# Patient Record
Sex: Female | Born: 1990 | Hispanic: No | Marital: Married | Smoking: Former smoker
Health system: Southern US, Community
[De-identification: ages and names within clinical notes are randomized; demographics above are authoritative.]

## PROBLEM LIST (undated history)

## (undated) DIAGNOSIS — J069 Acute upper respiratory infection, unspecified: Secondary | ICD-10-CM

## (undated) DIAGNOSIS — L509 Urticaria, unspecified: Secondary | ICD-10-CM

## (undated) DIAGNOSIS — T7840XA Allergy, unspecified, initial encounter: Secondary | ICD-10-CM

## (undated) DIAGNOSIS — R0600 Dyspnea, unspecified: Secondary | ICD-10-CM

## (undated) DIAGNOSIS — K219 Gastro-esophageal reflux disease without esophagitis: Secondary | ICD-10-CM

## (undated) DIAGNOSIS — L858 Other specified epidermal thickening: Secondary | ICD-10-CM

## (undated) DIAGNOSIS — E282 Polycystic ovarian syndrome: Secondary | ICD-10-CM

## (undated) DIAGNOSIS — J45909 Unspecified asthma, uncomplicated: Secondary | ICD-10-CM

## (undated) DIAGNOSIS — R519 Headache, unspecified: Secondary | ICD-10-CM

## (undated) HISTORY — PX: OTHER SURGICAL HISTORY: SHX169

## (undated) HISTORY — DX: Gastro-esophageal reflux disease without esophagitis: K21.9

## (undated) HISTORY — DX: Acute upper respiratory infection, unspecified: J06.9

## (undated) HISTORY — DX: Unspecified asthma, uncomplicated: J45.909

## (undated) HISTORY — DX: Allergy, unspecified, initial encounter: T78.40XA

## (undated) HISTORY — DX: Urticaria, unspecified: L50.9

## (undated) HISTORY — DX: Dyspnea, unspecified: R06.00

---

## 2020-10-08 ENCOUNTER — Other Ambulatory Visit: Payer: Self-pay

## 2020-10-08 ENCOUNTER — Inpatient Hospital Stay (HOSPITAL_COMMUNITY)
Admission: AD | Admit: 2020-10-08 | Discharge: 2020-10-08 | Disposition: A | Discharge status: Home / Self Care | Payer: Self-pay | Attending: Family Medicine | Admitting: Family Medicine

## 2020-10-08 ENCOUNTER — Encounter (HOSPITAL_COMMUNITY): Payer: Self-pay | Admitting: Obstetrics and Gynecology

## 2020-10-08 DIAGNOSIS — Z3A22 22 weeks gestation of pregnancy: Secondary | ICD-10-CM

## 2020-10-08 DIAGNOSIS — Z3A24 24 weeks gestation of pregnancy: Secondary | ICD-10-CM

## 2020-10-08 DIAGNOSIS — Z3492 Encounter for supervision of normal pregnancy, unspecified, second trimester: Secondary | ICD-10-CM | POA: Insufficient documentation

## 2020-10-08 DIAGNOSIS — Z3689 Encounter for other specified antenatal screening: Secondary | ICD-10-CM

## 2020-10-08 NOTE — Discharge Instructions (Signed)

## 2020-10-08 NOTE — MAU Provider Note (Signed)
[name] is a 30 y.o. G1P0 pregnant female at [redacted]w[redacted]d presenting to MAU to establish routine OB care. She moved here from [place] and has been unable to establish routine OB care anywhere else due to her advanced gestational age. She has no physical complaints and denies vaginal bleeding, contractions, or LOF.  O BP 111/69 (BP Location: Right Arm)    Pulse 90    Temp 98.3 F (36.8 C) (Oral)    Resp 20    Ht 5\' 3"  (1.6 m)    Wt 219 lb 8 oz (99.6 kg)    SpO2 97%    BMI 38.88 kg/m  Physical Exam Vitals and nursing note reviewed.  Constitutional:      General: She is not in acute distress.    Appearance: Normal appearance. She is normal weight. She is not ill-appearing.  HENT:     Head: Normocephalic.     Mouth/Throat:     Mouth: Mucous membranes are moist.  Eyes:     Pupils: Pupils are equal, round, and reactive to light.  Cardiovascular:     Rate and Rhythm: Normal rate.     Pulses: Normal pulses.  Pulmonary:     Effort: Pulmonary effort is normal.  Abdominal:     Palpations: Abdomen is soft.     Comments: Gravid  Musculoskeletal:        General: Normal range of motion.     Cervical back: Normal range of motion.  Skin:    General: Skin is warm and dry.     Capillary Refill: Capillary refill takes less than 2 seconds.  Neurological:     Mental Status: She is alert and oriented to person, place, and time.  Psychiatric:        Mood and Affect: Mood normal.        Behavior: Behavior normal.        Thought Content: Thought content normal.        Judgment: Judgment normal.   FHR: 144  A [redacted] weeks pregnant with fetal heart tones present Medical screening exam complete  P Discharge from MAU in stable condition Pt does not have insurance, will call to establish care at [facility] so she can have patient navigation services as well Warning signs for worsening condition that would warrant emergency follow-up discussed Patient may return to MAU as  needed for emergent OB/GYN related complaints  , CNM 10/08/2020 1:05 PM

## 2020-10-08 NOTE — MAU Note (Addendum)
Presents for general check up.  Reports moved to [place] from [place] and hasn't been seen a MD since arrival.  Reports +FM.  Denies VB or LOF.

## 2020-10-28 ENCOUNTER — Ambulatory Visit (INDEPENDENT_AMBULATORY_CARE_PROVIDER_SITE_OTHER): Payer: 59 | Admitting: Nurse Practitioner

## 2020-10-28 ENCOUNTER — Other Ambulatory Visit: Payer: Self-pay

## 2020-10-28 ENCOUNTER — Encounter: Payer: Self-pay | Admitting: Nurse Practitioner

## 2020-10-28 VITALS — BP 114/71 | HR 101 | Wt 225.2 lb

## 2020-10-28 DIAGNOSIS — Z6834 Body mass index (BMI) 34.0-34.9, adult: Secondary | ICD-10-CM | POA: Insufficient documentation

## 2020-10-28 DIAGNOSIS — Z6841 Body Mass Index (BMI) 40.0 and over, adult: Secondary | ICD-10-CM | POA: Insufficient documentation

## 2020-10-28 DIAGNOSIS — Z3490 Encounter for supervision of normal pregnancy, unspecified, unspecified trimester: Secondary | ICD-10-CM

## 2020-10-28 DIAGNOSIS — O34219 Maternal care for unspecified type scar from previous cesarean delivery: Secondary | ICD-10-CM | POA: Insufficient documentation

## 2020-10-28 DIAGNOSIS — O0990 Supervision of high risk pregnancy, unspecified, unspecified trimester: Secondary | ICD-10-CM | POA: Insufficient documentation

## 2020-10-28 DIAGNOSIS — Z3A27 27 weeks gestation of pregnancy: Secondary | ICD-10-CM

## 2020-10-28 LAB — POCT URINALYSIS DIP (DEVICE)
Bilirubin Urine: NEGATIVE
Glucose, UA: 100 mg/dL — AB
Leukocytes,Ua: NEGATIVE
Nitrite: NEGATIVE
Protein, ur: NEGATIVE mg/dL
Specific Gravity, Urine: >=1.03 (ref 1.005–1.030)
Urobilinogen, UA: 0.2 mg/dL (ref 0.0–1.0)
pH: 5.5 (ref 5.0–8.0)

## 2020-10-28 MED ORDER — BLOOD PRESSURE KIT DEVI: 1.0000 | 0 refills | Status: DC | PRN

## 2020-10-28 NOTE — Progress Notes (Signed)
Subjective:   Shawna Curtis is a 30 y.o. [phone] at 2w2dby date given to her by her doctor in [place]-  being seen today for her first obstetrical visit.  She is a transfer of care as [place] has now moved [place] the [place]  Her obstetrical history is significant for obesity and previous C/S. Patient does intend to breast feed. Pregnancy history fully reviewed.  Some records are available from [place]- scanned into chart.  [place] She has a 30year old and had a Cesarean Birth for [place] baby [place].  Possibly is interested in VBAC. She breastfed for 2 years with the last pregnancy. She has insurance and it needs to be added to her records in this office.  Patient reports no complaints.  HISTORY: OB History  Gravida Para Term Preterm AB Living  '4 1 1 ' 0 2 1  SAB IAB Ectopic Multiple Live Births  2 0 0 0 1    # Outcome Date GA Lbr Len/2nd Weight Sex Delivery Anes PTL Lv  4 Current           3 Term 2018 [id number] lb 11.5 oz (3.5 kg)  CS-Unspec   LIV     Birth Comments: wnl ; c/s due to doctors preference due to hx 2 sab and maybe due to felt opening not enough  2 SAB 2017 [id number]      1 SAB 2016 6w06w0d      History reviewed. No pertinent past medical history. Past Surgical History:  Procedure Laterality Date  . CESAREAN SECTION     Family History  Problem Relation Age of Onset  . Hypertension Father    Social History   Tobacco Use  . Smoking status: Former Smoker    Types: Cigarettes    Quit date: 2010    Years since quitting: 12.0  . Smokeless tobacco: Never Used  Vaping Use  . Vaping Use: Never used  Substance Use Topics  . Alcohol use: Not Currently    Comment: occasionally  . Drug use: Never   No Known Allergies Current Outpatient Medications on File Prior to Visit  Medication Sig Dispense Refill  . Prenatal Vit-Fe Fumarate-FA (PRENATAL VITAMINS PO) Take 1  capsule by mouth daily.     No current facility-administered medications on file prior to visit.     Exam   Vitals:   10/28/20 1040  BP: 114/71  Pulse: (!) 101  Weight: 225 lb 3.2 oz (102.2 kg)   Fetal Heart Rate (bpm): 152  Uterus:  Fundal Height: 31 cm  Pelvic Exam: Perineum: Deferred pelvic   Vulva:    Vagina:     Cervix:    Adnexa:    Bony Pelvis:   System: General: well-developed, well-nourished female in no acute distress   Breast:  deferred   Skin: normal coloration and turgor, no rashes   Neurologic: oriented, normal, negative, normal mood   Extremities: normal strength, tone, and muscle mass, ROM of all joints is normal   HEENT extraocular movement intact and sclera clear, anicteric   Mouth/Teeth deferred   Neck supple and no masses, normal thyroid   Cardiovascular: regular rate and rhythm   Respiratory:  no respiratory distress, normal breath sounds   Abdomen: soft, non-tender; no masses,  no organomegaly     Assessment:  Pregnancy: [id number] Patient Active Problem List   Diagnosis Date Noted  . Supervision of low-risk pregnancy 10/28/2020  . Obesity in pregnancy 10/28/2020  . History of cesarean delivery, currently pregnant 10/28/2020     Plan:  1. Encounter for supervision of low-risk pregnancy, antepartum Doing well - no complaints Will schedule for anatomy scan and use to confirm dating - given LMP from her MD in [place]. Speaks English Husband works for a Arts administrator in the area [place] at next visit.  - [facility] AMB BABYSCRIPTS SCHEDULE OPTIMIZATION - Blood Pressure Monitoring (BLOOD PRESSURE KIT) DEVI; 1 Device by Does not apply route as needed.  Dispense: 1 each; Refill: 0 - [place] MFM OB DETAIL +14 WK; Future - CBC/D/Plt+RPR+Rh+ABO+Rub Ab... - Hemoglobin A1c - Culture, OB Urine  2. Obesity in pregnancy  - [place] MFM OB DETAIL +14 WK; Future  3.  History of C/S  Considering VBAC - Will schedule with MD for delivery plan  Initial labs  drawn. Continue prenatal vitamins. Genetic Screening discussed, NIPS: discussed and waiting to see if insurance covers maternity care. Ultrasound discussed; fetal anatomic survey: ordered. Problem list reviewed and updated. The nature of [place] with multiple MDs and other Advanced Practice Providers was explained to patient; also emphasized that residents, students are part of our team. Routine obstetric precautions reviewed. Return in 1 week (on 11/04/2020) for schedule for early morning fasting Glucola and MD visit for birth plan.  Total face-to-face time with patient: 40 minutes.  Over 50% of encounter was spent on counseling and coordination of care.   Earlie Server, FNP Family Nurse Practitioner, [facility] for [facility], [place] 10/28/2020 12:58 PM  Addendum: Anatomy scan at MFM with [facility] as 01-16-21  Will need to discuss with client at next visit.   Earlie Server, RN, MSN, NP-BC Nurse Practitioner, [facility] for [employer], [place] 10/30/2020 3:22 PM

## 2020-10-28 NOTE — Progress Notes (Signed)
Here for initial prenatal visit. States EDD was told to her by doctor in [place] where she started prenatal care. She brought records. Last visit there about 7 weeks ago. Given new patient packet.  C/o SOB at times. Oxygen saturation 97%. Sent [employer] app to download.Sent bp cuff rx.  Sent MyChart text to sign up. Declines flu shot . Jacques Earthly

## 2020-10-29 ENCOUNTER — Ambulatory Visit: Payer: 59 | Admitting: *Deleted

## 2020-10-29 ENCOUNTER — Other Ambulatory Visit: Payer: Self-pay | Admitting: *Deleted

## 2020-10-29 ENCOUNTER — Ambulatory Visit: Payer: 59 | Attending: Obstetrics and Gynecology

## 2020-10-29 ENCOUNTER — Encounter: Payer: Self-pay | Admitting: *Deleted

## 2020-10-29 DIAGNOSIS — Z6834 Body mass index (BMI) 34.0-34.9, adult: Secondary | ICD-10-CM

## 2020-10-29 DIAGNOSIS — Z3490 Encounter for supervision of normal pregnancy, unspecified, unspecified trimester: Secondary | ICD-10-CM | POA: Diagnosis present

## 2020-10-29 LAB — CBC/D/PLT+RPR+RH+ABO+RUB AB...
Antibody Screen: NEGATIVE
Basophils Absolute: 0.1 10*3/uL (ref 0.0–0.2)
Basos: 1 %
EOS (ABSOLUTE): 0.3 10*3/uL (ref 0.0–0.4)
Eos: 2 %
HCV Ab: <0.1 s/co ratio (ref 0.0–0.9)
HIV Screen 4th Generation wRfx: NONREACTIVE
Hematocrit: 37.1 % (ref 34.0–46.6)
Hemoglobin: 12 g/dL (ref 11.1–15.9)
Hepatitis B Surface Ag: NEGATIVE
Immature Grans (Abs): 0.5 10*3/uL — ABNORMAL HIGH (ref 0.0–0.1)
Immature Granulocytes: 4 %
Lymphocytes Absolute: 1.8 10*3/uL (ref 0.7–3.1)
Lymphs: 15 %
MCH: 27.4 pg (ref 26.6–33.0)
MCHC: 32.3 g/dL (ref 31.5–35.7)
MCV: 85 fL (ref 79–97)
Monocytes Absolute: 0.5 10*3/uL (ref 0.1–0.9)
Monocytes: 4 %
Neutrophils Absolute: 9.2 10*3/uL — ABNORMAL HIGH (ref 1.4–7.0)
Neutrophils: 74 %
Platelets: 330 10*3/uL (ref 150–450)
RBC: 4.38 x10E6/uL (ref 3.77–5.28)
RDW: 13.9 % (ref 11.7–15.4)
RPR Ser Ql: NONREACTIVE
Rh Factor: POSITIVE
Rubella Antibodies, IGG: <0.9 index — ABNORMAL LOW (ref >0.99)
WBC: 12.3 10*3/uL — ABNORMAL HIGH (ref 3.4–10.8)

## 2020-10-29 LAB — HEMOGLOBIN A1C
Est. average glucose Bld gHb Est-mCnc: 111 mg/dL
Hgb A1c MFr Bld: 5.5 % (ref 4.8–5.6)

## 2020-10-29 LAB — HCV INTERPRETATION

## 2020-10-30 ENCOUNTER — Other Ambulatory Visit: Payer: Self-pay | Admitting: *Deleted

## 2020-10-30 DIAGNOSIS — Z3492 Encounter for supervision of normal pregnancy, unspecified, second trimester: Secondary | ICD-10-CM

## 2020-10-31 LAB — CULTURE, OB URINE

## 2020-10-31 LAB — URINE CULTURE, OB REFLEX

## 2020-11-03 ENCOUNTER — Telehealth: Payer: Self-pay

## 2020-11-04 ENCOUNTER — Ambulatory Visit (INDEPENDENT_AMBULATORY_CARE_PROVIDER_SITE_OTHER): Payer: 59 | Admitting: Family Medicine

## 2020-11-04 ENCOUNTER — Encounter: Payer: Self-pay | Admitting: Family Medicine

## 2020-11-04 ENCOUNTER — Other Ambulatory Visit: Payer: 59

## 2020-11-04 ENCOUNTER — Other Ambulatory Visit: Payer: Self-pay

## 2020-11-04 VITALS — BP 103/71 | HR 103 | Wt 223.4 lb

## 2020-11-04 DIAGNOSIS — Z3490 Encounter for supervision of normal pregnancy, unspecified, unspecified trimester: Secondary | ICD-10-CM

## 2020-11-04 DIAGNOSIS — H53419 Scotoma involving central area, unspecified eye: Secondary | ICD-10-CM | POA: Insufficient documentation

## 2020-11-04 DIAGNOSIS — O34219 Maternal care for unspecified type scar from previous cesarean delivery: Secondary | ICD-10-CM

## 2020-11-04 DIAGNOSIS — O99891 Other specified diseases and conditions complicating pregnancy: Secondary | ICD-10-CM

## 2020-11-04 DIAGNOSIS — O09899 Supervision of other high risk pregnancies, unspecified trimester: Secondary | ICD-10-CM

## 2020-11-04 DIAGNOSIS — Z2839 Other underimmunization status: Secondary | ICD-10-CM | POA: Insufficient documentation

## 2020-11-04 DIAGNOSIS — H53412 Scotoma involving central area, left eye: Secondary | ICD-10-CM

## 2020-11-04 DIAGNOSIS — Z283 Underimmunization status: Secondary | ICD-10-CM

## 2020-11-04 NOTE — Patient Instructions (Addendum)
[facility] OFFICE [address], [place], [place], [address] T [phone] Email: [email or web address]  Go on 11/07/2020 Friday at 8 AM go to [facility] and Dr. Allyne Gee will see you    Contraception Choices Contraception, also called birth control, refers to methods or devices that prevent pregnancy. Hormonal methods Contraceptive implant A contraceptive implant is a thin, plastic tube that contains a hormone that prevents pregnancy. It is different from an intrauterine device (IUD). It is inserted into the upper part of the arm by a health care provider. Implants can be effective for up to 3 years. Progestin-only injections Progestin-only injections are injections of progestin, a synthetic form of the hormone progesterone. They are given every 3 months by a health care provider. Birth control pills Birth control pills are pills that contain hormones that prevent pregnancy. They must be taken once a day, preferably at the same time each day. A prescription is needed to use this method of contraception. Birth control patch The birth control patch contains hormones that prevent pregnancy. It is placed on the skin and must be changed once a week for three weeks and removed on the fourth week. A prescription is needed to use this method of contraception. Vaginal ring A vaginal ring contains hormones that prevent pregnancy. It is placed in the vagina for three weeks and removed on the fourth week. After that, the process is repeated with a new ring. A prescription is needed to use this method of contraception. Emergency contraceptive Emergency contraceptives prevent pregnancy after unprotected sex. They come in pill form and can be taken up to 5 days after sex. They work best the sooner they are taken after having sex. Most emergency contraceptives are available without a prescription. This method should not be used as your only form of birth control.    Barrier methods Female condom A female condom is a thin sheath that is worn over the penis during sex. Condoms keep sperm from going inside a woman's body. They can be used with a sperm-killing substance (spermicide) to increase their effectiveness. They should be thrown away after one use. Female condom A female condom is a soft, loose-fitting sheath that is put into the vagina before sex. The condom keeps sperm from going inside a woman's body. They should be thrown away after one use. Diaphragm A diaphragm is a soft, dome-shaped barrier. It is inserted into the vagina before sex, along with a spermicide. The diaphragm blocks sperm from entering the uterus, and the spermicide kills sperm. A diaphragm should be left in the vagina for 6-8 hours after sex and removed within 24 hours. A diaphragm is prescribed and fitted by a health care provider. A diaphragm should be replaced every 1-2 years, after giving birth, after gaining more than 15 lb (6.8 kg), and after pelvic surgery. Cervical cap A cervical cap is a round, soft latex or plastic cup that fits over the cervix. It is inserted into the vagina before sex, along with spermicide. It blocks sperm from entering the uterus. The cap should be left in place for 6-8 hours after sex and removed within 48 hours. A cervical cap must be prescribed and fitted by a health care provider. It should be replaced every 2 years. Sponge A sponge is a soft, circular piece of polyurethane foam with spermicide in it. The sponge helps block sperm from entering the uterus, and the spermicide kills sperm. To use it, you make it wet and then insert it  into the vagina. It should be inserted before sex, left in for at least 6 hours after sex, and removed and thrown away within 30 hours. Spermicides Spermicides are chemicals that kill or block sperm from entering the cervix and uterus. They can come as a cream, Leveda, suppository, foam, or tablet. A spermicide should be inserted  into the vagina with an applicator at least 10-15 minutes before sex to allow time for it to work. The process must be repeated every time you have sex. Spermicides do not require a prescription.   Intrauterine contraception Intrauterine device (IUD) An IUD is a T-shaped device that is put in a woman's uterus. There are two types:  Hormone IUD.This type contains progestin, a synthetic form of the hormone progesterone. This type can stay in place for 3-5 years.  Copper IUD.This type is wrapped in copper wire. It can stay in place for 10 years. Permanent methods of contraception Female tubal ligation In this method, a woman's fallopian tubes are sealed, tied, or blocked during surgery to prevent eggs from traveling to the uterus. Hysteroscopic sterilization In this method, a small, flexible insert is placed into each fallopian tube. The inserts cause scar tissue to form in the fallopian tubes and block them, so sperm cannot reach an egg. The procedure takes about 3 months to be effective. Another form of birth control must be used during those 3 months. Female sterilization This is a procedure to tie off the tubes that carry sperm (vasectomy). After the procedure, the man can still ejaculate fluid (semen). Another form of birth control must be used for 3 months after the procedure. Natural planning methods Natural family planning In this method, a couple does not have sex on days when the woman could become pregnant. Calendar method In this method, the woman keeps track of the length of each menstrual cycle, identifies the days when pregnancy can happen, and does not have sex on those days. Ovulation method In this method, a couple avoids sex during ovulation. Symptothermal method This method involves not having sex during ovulation. The woman typically checks for ovulation by watching changes in her temperature and in the consistency of cervical mucus. Post-ovulation method In this method, a  couple waits to have sex until after ovulation. Where to find more information  Centers for Disease Control and Prevention: [employer].com.brwww.cdc.gov Summary  Contraception, also called birth control, refers to methods or devices that prevent pregnancy.  Hormonal methods of contraception include implants, injections, pills, patches, vaginal rings, and emergency contraceptives.  Barrier methods of contraception can include female condoms, female condoms, diaphragms, cervical caps, sponges, and spermicides.  There are two types of IUDs (intrauterine devices). An IUD can be put in a woman's uterus to prevent pregnancy for 3-5 years.  Permanent sterilization can be done through a procedure for males and females. Natural family planning methods involve nothaving sex on days when the woman could become pregnant. This information is not intended to replace advice given to you by your health care provider. Make sure you discuss any questions you have with your health care provider. Document Revised: 02/18/2020 Document Reviewed: 02/18/2020 Elsevier Patient Education  2021 Elsevier Inc.   Breastfeeding  Choosing to breastfeed is one of the best decisions you can make for yourself and your baby. A change in hormones during pregnancy causes your breasts to make breast milk in your milk-producing glands. Hormones prevent breast milk from being released before your baby is born. They also prompt milk flow after birth.  Once breastfeeding has begun, thoughts of your baby, as well as his or her sucking or crying, can stimulate the release of milk from your milk-producing glands. Benefits of breastfeeding Research shows that breastfeeding offers many health benefits for infants and mothers. It also offers a cost-free and convenient way to feed your baby. For your baby  Your first milk (colostrum) helps your baby's digestive system to function better.  Special cells in your milk (antibodies) help your baby to fight off  infections.  Breastfed babies are less likely to develop asthma, allergies, obesity, or type 2 diabetes. They are also at lower risk for sudden infant death syndrome (SIDS).  Nutrients in breast milk are better able to meet your baby's needs compared to infant formula.  Breast milk improves your baby's brain development. For you  Breastfeeding helps to create a very special bond between you and your baby.  Breastfeeding is convenient. Breast milk costs nothing and is always available at the correct temperature.  Breastfeeding helps to burn calories. It helps you to lose the weight that you gained during pregnancy.  Breastfeeding makes your uterus return faster to its size before pregnancy. It also slows bleeding (lochia) after you give birth.  Breastfeeding helps to lower your risk of developing type 2 diabetes, osteoporosis, rheumatoid arthritis, cardiovascular disease, and breast, ovarian, uterine, and endometrial cancer later in life. Breastfeeding basics Starting breastfeeding  Find a comfortable place to sit or lie down, with your neck and back well-supported.  Place a pillow or a rolled-up blanket under your baby to bring him or her to the level of your breast (if you are seated). Nursing pillows are specially designed to help support your arms and your baby while you breastfeed.  Make sure that your baby's tummy (abdomen) is facing your abdomen.  Gently massage your breast. With your fingertips, massage from the outer edges of your breast inward toward the nipple. This encourages milk flow. If your milk flows slowly, you may need to continue this action during the feeding.  Support your breast with 4 fingers underneath and your thumb above your nipple (make the letter "C" with your hand). Make sure your fingers are well away from your nipple and your baby's mouth.  Stroke your baby's lips gently with your finger or nipple.  When your baby's mouth is open wide enough, quickly  bring your baby to your breast, placing your entire nipple and as much of the areola as possible into your baby's mouth. The areola is the colored area around your nipple. ? More areola should be visible above your baby's upper lip than below the lower lip. ? Your baby's lips should be opened and extended outward (flanged) to ensure an adequate, comfortable latch. ? Your baby's tongue should be between his or her lower gum and your breast.  Make sure that your baby's mouth is correctly positioned around your nipple (latched). Your baby's lips should create a seal on your breast and be turned out (everted).  It is common for your baby to suck about 2-3 minutes in order to start the flow of breast milk. Latching Teaching your baby how to latch onto your breast properly is very important. An improper latch can cause nipple pain, decreased milk supply, and poor weight gain in your baby. Also, if your baby is not latched onto your nipple properly, he or she may swallow some air during feeding. This can make your baby fussy. Burping your baby when you switch breasts during the  feeding can help to get rid of the air. However, teaching your baby to latch on properly is still the best way to prevent fussiness from swallowing air while breastfeeding. Signs that your baby has successfully latched onto your nipple  Silent tugging or silent sucking, without causing you pain. Infant's lips should be extended outward (flanged).  Swallowing heard between every 3-4 sucks once your milk has started to flow (after your let-down milk reflex occurs).  Muscle movement above and in front of his or her ears while sucking. Signs that your baby has not successfully latched onto your nipple  Sucking sounds or smacking sounds from your baby while breastfeeding.  Nipple pain. If you think your baby has not latched on correctly, slip your finger into the corner of your baby's mouth to break the suction and place it between  your baby's gums. Attempt to start breastfeeding again. Signs of successful breastfeeding Signs from your baby  Your baby will gradually decrease the number of sucks or will completely stop sucking.  Your baby will fall asleep.  Your baby's body will relax.  Your baby will retain a small amount of milk in his or her mouth.  Your baby will let go of your breast by himself or herself. Signs from you  Breasts that have increased in firmness, weight, and size 1-3 hours after feeding.  Breasts that are softer immediately after breastfeeding.  Increased milk volume, as well as a change in milk consistency and color by the fifth day of breastfeeding.  Nipples that are not sore, cracked, or bleeding. Signs that your baby is getting enough milk  Wetting at least 1-2 diapers during the first 24 hours after birth.  Wetting at least 5-6 diapers every 24 hours for the first week after birth. The urine should be clear or pale yellow by the age of 5 days.  Wetting 6-8 diapers every 24 hours as your baby continues to grow and develop.  At least 3 stools in a 24-hour period by the age of 5 days. The stool should be soft and yellow.  At least 3 stools in a 24-hour period by the age of 7 days. The stool should be seedy and yellow.  No loss of weight greater than 10% of birth weight during the first 3 days of life.  Average weight gain of 4-7 oz (113-198 g) per week after the age of 4 days.  Consistent daily weight gain by the age of 5 days, without weight loss after the age of 2 weeks. After a feeding, your baby may spit up a small amount of milk. This is normal. Breastfeeding frequency and duration Frequent feeding will help you make more milk and can prevent sore nipples and extremely full breasts (breast engorgement). Breastfeed when you feel the need to reduce the fullness of your breasts or when your baby shows signs of hunger. This is called "breastfeeding on demand." Signs that your baby  is hungry include:  Increased alertness, activity, or restlessness.  Movement of the head from side to side.  Opening of the mouth when the corner of the mouth or cheek is stroked (rooting).  Increased sucking sounds, smacking lips, cooing, sighing, or squeaking.  Hand-to-mouth movements and sucking on fingers or hands.  Fussing or crying. Avoid introducing a pacifier to your baby in the first 4-6 weeks after your baby is born. After this time, you may choose to use a pacifier. Research has shown that pacifier use during the first year of  a baby's life decreases the risk of sudden infant death syndrome (SIDS). Allow your baby to feed on each breast as long as he or she wants. When your baby unlatches or falls asleep while feeding from the first breast, offer the second breast. Because newborns are often sleepy in the first few weeks of life, you may need to awaken your baby to get him or her to feed. Breastfeeding times will vary from baby to baby. However, the following rules can serve as a guide to help you make sure that your baby is properly fed:  Newborns (babies 19 weeks of age or younger) may breastfeed every 1-3 hours.  Newborns should not go without breastfeeding for longer than 3 hours during the day or 5 hours during the night.  You should breastfeed your baby a minimum of 8 times in a 24-hour period. Breast milk pumping Pumping and storing breast milk allows you to make sure that your baby is exclusively fed your breast milk, even at times when you are unable to breastfeed. This is especially important if you go back to work while you are still breastfeeding, or if you are not able to be present during feedings. Your lactation consultant can help you find a method of pumping that works best for you and give you guidelines about how long it is safe to store breast milk.      Caring for your breasts while you breastfeed Nipples can become dry, cracked, and sore while  breastfeeding. The following recommendations can help keep your breasts moisturized and healthy:  Avoid using soap on your nipples.  Wear a supportive bra designed especially for nursing. Avoid wearing underwire-style bras or extremely tight bras (sports bras).  Air-dry your nipples for 3-4 minutes after each feeding.  Use only cotton bra pads to absorb leaked breast milk. Leaking of breast milk between feedings is normal.  Use lanolin on your nipples after breastfeeding. Lanolin helps to maintain your skin's normal moisture barrier. Pure lanolin is not harmful (not toxic) to your baby. You may also hand express a few drops of breast milk and gently massage that milk into your nipples and allow the milk to air-dry. In the first few weeks after giving birth, some women experience breast engorgement. Engorgement can make your breasts feel heavy, warm, and tender to the touch. Engorgement peaks within 3-5 days after you give birth. The following recommendations can help to ease engorgement:  Completely empty your breasts while breastfeeding or pumping. You may want to start by applying warm, moist heat (in the shower or with warm, water-soaked hand towels) just before feeding or pumping. This increases circulation and helps the milk flow. If your baby does not completely empty your breasts while breastfeeding, pump any extra milk after he or she is finished.  Apply ice packs to your breasts immediately after breastfeeding or pumping, unless this is too uncomfortable for you. To do this: ? Put ice in a plastic bag. ? Place a towel between your skin and the bag. ? Leave the ice on for 20 minutes, 2-3 times a day.  Make sure that your baby is latched on and positioned properly while breastfeeding. If engorgement persists after 48 hours of following these recommendations, contact your health care provider or a Advertising copywriter. Overall health care recommendations while breastfeeding  Eat 3  healthy meals and 3 snacks every day. Well-nourished mothers who are breastfeeding need an additional 450-500 calories a day. You can meet this requirement by increasing  the amount of a balanced diet that you eat.  Drink enough water to keep your urine pale yellow or clear.  Rest often, relax, and continue to take your prenatal vitamins to prevent fatigue, stress, and low vitamin and mineral levels in your body (nutrient deficiencies).  Do not use any products that contain nicotine or tobacco, such as cigarettes and e-cigarettes. Your baby may be harmed by chemicals from cigarettes that pass into breast milk and exposure to secondhand smoke. If you need help quitting, ask your health care provider.  Avoid alcohol.  Do not use illegal drugs or marijuana.  Talk with your health care provider before taking any medicines. These include over-the-counter and prescription medicines as well as vitamins and herbal supplements. Some medicines that may be harmful to your baby can pass through breast milk.  It is possible to become pregnant while breastfeeding. If birth control is desired, ask your health care provider about options that will be safe while breastfeeding your baby. Where to find more information: [employer]: www.llli.org Contact a health care provider if:  You feel like you want to stop breastfeeding or have become frustrated with breastfeeding.  Your nipples are cracked or bleeding.  Your breasts are red, tender, or warm.  You have: ? Painful breasts or nipples. ? A swollen area on either breast. ? A fever or chills. ? Nausea or vomiting. ? Drainage other than breast milk from your nipples.  Your breasts do not become full before feedings by the fifth day after you give birth.  You feel sad and depressed.  Your baby is: ? Too sleepy to eat well. ? Having trouble sleeping. ? More than 51 week old and wetting fewer than 6 diapers in a 24-hour period. ? Not  gaining weight by 18 days of age.  Your baby has fewer than 3 stools in a 24-hour period.  Your baby's skin or the white parts of his or her eyes become yellow. Get help right away if:  Your baby is overly tired (lethargic) and does not want to wake up and feed.  Your baby develops an unexplained fever. Summary  Breastfeeding offers many health benefits for infant and mothers.  Try to breastfeed your infant when he or she shows early signs of hunger.  Gently tickle or stroke your baby's lips with your finger or nipple to allow the baby to open his or her mouth. Bring the baby to your breast. Make sure that much of the areola is in your baby's mouth. Offer one side and burp the baby before you offer the other side.  Talk with your health care provider or lactation consultant if you have questions or you face problems as you breastfeed. This information is not intended to replace advice given to you by your health care provider. Make sure you discuss any questions you have with your health care provider. Document Revised: 12/08/2017 Document Reviewed: 10/15/2016 Elsevier Patient Education  2021 [employer].

## 2020-11-04 NOTE — Progress Notes (Signed)
   Subjective:  Shawna Curtis is a 30 y.o. [phone] at [redacted]w[redacted]d being seen today for ongoing prenatal care.  She is currently monitored for the following issues for this low-risk pregnancy and has Supervision of low-risk pregnancy; BMI 34.0-34.9,adult; and History of cesarean delivery, currently pregnant on their problem list.  Patient reports purple circle of vision loss in her left eye that has been present for one week.  Contractions: Not present. Vag. Bleeding: None.  Movement: Present. Denies leaking of fluid.   The following portions of the patient's history were reviewed and updated as appropriate: allergies, current medications, past family history, past medical history, past social history, past surgical history and problem list. Problem list updated.  Objective:   Vitals:   11/04/20 0852  BP: 103/71  Pulse: (!) 103  Weight: 223 lb 6.4 oz (101.3 kg)    Fetal Status: Fetal Heart Rate (bpm): 150   Movement: Present     General:  Alert, oriented and cooperative. Patient is in no acute distress.  Skin: Skin is warm and dry. No rash noted.   Cardiovascular: Normal heart rate noted  Respiratory: Normal respiratory effort, no problems with respiration noted  Abdomen: Soft, gravid, appropriate for gestational age. Pain/Pressure: Present     Pelvic: Vag. Bleeding: None     Cervical exam deferred        Extremities: Normal range of motion.  Edema: None  Neuro: EOM intact, visual fields intact. Opthalmoscope evaluation shows no retinal hemorrhages, no evidence of retinal detachment, no optic disc cupping, normal vasculature  Mental Status: Anxious mood and affect.   Urinalysis:      Assessment and Plan:  Pregnancy: [id number] at [redacted]w[redacted]d  1. Encounter for supervision of low-risk pregnancy, antepartum 28wk labs TDaP offered today, will decide at next visit BP and FHR normal - CBC - HIV Antibody (routine testing w rflx) - RPR  2. History of cesarean delivery, currently  pregnant Long conversation with patient regarding risks and benefits of TOLAC vs scheduled RCS Per discussion with patient she was told she would be unable to have a vaginal birth and had a scheduled cesarean at 38 weeks that was uncomplicated Reviewed risk and benefits, encouraged her to [facility] She needs to think about, will let us know at her next visit  3. Scotoma Patient reporting new scotoma present for the past week, no hx of thromboembolism, HTN, DM Normal neuro and opthalmoscope exams Spoke with on call ophthalmologist Dr. Allyne Gee, no need for ED eval but he will see patient at [facility] Friday morning for an evaluation  Preterm labor symptoms and general obstetric precautions including but not limited to vaginal bleeding, contractions, leaking of fluid and fetal movement were reviewed in detail with the patient. Please refer to After Visit Summary for other counseling recommendations.  Return in 2 weeks (on 11/18/2020) for [facility], ob visit.   Venora Maples, MD

## 2020-11-05 LAB — CBC
Hematocrit: 36.5 % (ref 34.0–46.6)
Hemoglobin: 11.6 g/dL (ref 11.1–15.9)
MCH: 26.9 pg (ref 26.6–33.0)
MCHC: 31.8 g/dL (ref 31.5–35.7)
MCV: 85 fL (ref 79–97)
Platelets: 310 x10E3/uL (ref 150–450)
RBC: 4.32 x10E6/uL (ref 3.77–5.28)
RDW: 13.8 % (ref 11.7–15.4)
WBC: 13.5 x10E3/uL — ABNORMAL HIGH (ref 3.4–10.8)

## 2020-11-05 LAB — GLUCOSE TOLERANCE, 2 HOURS W/ 1HR
Glucose, 1 hour: 174 mg/dL (ref 65–179)
Glucose, 2 hour: 138 mg/dL (ref 65–152)
Glucose, Fasting: 86 mg/dL (ref 65–91)

## 2020-11-05 LAB — RPR: RPR Ser Ql: NONREACTIVE

## 2020-11-05 LAB — HIV ANTIBODY (ROUTINE TESTING W REFLEX): HIV Screen 4th Generation wRfx: NONREACTIVE

## 2020-11-17 NOTE — Telephone Encounter (Signed)
Opened in error

## 2020-11-18 ENCOUNTER — Other Ambulatory Visit: Payer: Self-pay

## 2020-11-18 ENCOUNTER — Ambulatory Visit (INDEPENDENT_AMBULATORY_CARE_PROVIDER_SITE_OTHER): Payer: 59 | Admitting: Nurse Practitioner

## 2020-11-18 VITALS — BP 100/70 | HR 106 | Wt 224.7 lb

## 2020-11-18 DIAGNOSIS — O34219 Maternal care for unspecified type scar from previous cesarean delivery: Secondary | ICD-10-CM

## 2020-11-18 DIAGNOSIS — O26899 Other specified pregnancy related conditions, unspecified trimester: Secondary | ICD-10-CM

## 2020-11-18 DIAGNOSIS — Z3A31 31 weeks gestation of pregnancy: Secondary | ICD-10-CM

## 2020-11-18 DIAGNOSIS — Z3493 Encounter for supervision of normal pregnancy, unspecified, third trimester: Secondary | ICD-10-CM

## 2020-11-18 DIAGNOSIS — R102 Pelvic and perineal pain: Secondary | ICD-10-CM

## 2020-11-18 DIAGNOSIS — H53412 Scotoma involving central area, left eye: Secondary | ICD-10-CM

## 2020-11-18 NOTE — Progress Notes (Signed)
    Subjective:  Shawna Curtis is a 30 y.o. [phone] at [redacted]w[redacted]d being seen today for ongoing prenatal care.  She is currently monitored for the following issues for this low-risk pregnancy and has Supervision of low-risk pregnancy; BMI 34.0-34.9,adult; History of cesarean delivery, currently pregnant; Rubella non-immune status, antepartum; and Scotoma on their problem list.  Patient reports pelvic pain.  Contractions: Not present. Vag. Bleeding: None.  Movement: Present. Denies leaking of fluid.   The following portions of the patient's history were reviewed and updated as appropriate: allergies, current medications, past family history, past medical history, past social history, past surgical history and problem list. Problem list updated.  Objective:   Vitals:   11/18/20 1053  BP: 100/70  Pulse: (!) 106  Weight: 224 lb 11.2 oz (101.9 kg)    Fetal Status: Fetal Heart Rate (bpm): 148 Fundal Height: 33 cm Movement: Present     General:  Alert, oriented and cooperative. Patient is in no acute distress.  Skin: Skin is warm and dry. No rash noted.   Cardiovascular: Normal heart rate noted  Respiratory: Normal respiratory effort, no problems with respiration noted  Abdomen: Soft, gravid, appropriate for gestational age. Pain/Pressure: Absent     Pelvic:  Cervical exam deferred        Extremities: Normal range of motion.  Edema: None  Mental Status: Normal mood and affect. Normal behavior. Normal judgment and thought content.   Urinalysis:      Assessment and Plan:  Pregnancy: G4P1021 at [redacted]w[redacted]d  1. Encounter for supervision of low-risk pregnancy in third trimester Discussed TDAP - was not well informed about the possibility of pertussus for a newborn.  Reviewed risks for newborns and that TDAP is used for all pregnant women across the [place].  Was not done with her previous child in [place].  2. Visual field scotoma of left eye Was not aware of the time of her appointment last  Friday Has not rescheduled - advised to call today to reschedule Still has symptoms in her left eye - does cause problems with her perception when walking  3. History of cesarean delivery, currently pregnant Planning TOLAC (she thinks) - needs to sign consent; did not sign at today's visit  4. Pelvic pain affecting pregnancy, antepartum Has had back pain since prior to pregnancy.  Is now worse with pregnancy and having lower abdominal and pelvic pain Suggested pregnancy support belt also - not covered by insurance Will do referral to PT also - Ambulatory referral to Physical Therapy  Preterm labor symptoms and general obstetric precautions including but not limited to vaginal bleeding, contractions, leaking of fluid and fetal movement were reviewed in detail with the patient. Please refer to After Visit Summary for other counseling recommendations.  Return in about 2 weeks (around 12/02/2020).  Nolene Bernheim, RN, MSN, NP-BC Nurse Practitioner, [facility] for [employer], [facility] 11/18/2020 12:05 PM

## 2020-11-18 NOTE — Patient Instructions (Addendum)
[email or web address]   Tdap (Tetanus, Diphtheria, Pertussis) Vaccine: What You Need to Know 1. Why get vaccinated? Tdap vaccine can prevent tetanus, diphtheria, and pertussis. Diphtheria and pertussis spread from person to person. Tetanus enters the body through cuts or wounds.  TETANUS (T) causes painful stiffening of the muscles. Tetanus can lead to serious health problems, including being unable to open the mouth, having trouble swallowing and breathing, or death.  DIPHTHERIA (D) can lead to difficulty breathing, heart failure, paralysis, or death.  PERTUSSIS (aP), also known as "whooping cough," can cause uncontrollable, violent coughing that makes it hard to breathe, eat, or drink. Pertussis can be extremely serious especially in babies and young children, causing pneumonia, convulsions, brain damage, or death. In teens and adults, it can cause weight loss, loss of bladder control, passing out, and rib fractures from severe coughing. 2. Tdap vaccine Tdap is only for children 7 years and older, adolescents, and adults.  Adolescents should receive a single dose of Tdap, preferably at age 30 or 12 years. Pregnant people should get a dose of Tdap during every pregnancy, preferably during the early part of the third trimester, to help protect the newborn from pertussis. Infants are most at risk for severe, life-threatening complications from pertussis. Adults who have never received Tdap should get a dose of Tdap. Also, adults should receive a booster dose of either Tdap or Td (a different vaccine that protects against tetanus and diphtheria but not pertussis) every 10 years, or after 5 years in the case of a severe or dirty wound or burn. Tdap may be given at the same time as other vaccines. 3. Talk with your health care provider Tell your vaccine provider if the person getting the vaccine:  Has had an allergic reaction after a previous dose of any vaccine that protects against tetanus,  diphtheria, or pertussis, or has any severe, life-threatening allergies  Has had a coma, decreased level of consciousness, or prolonged seizures within 7 days after a previous dose of any pertussis vaccine (DTP, DTaP, or Tdap)  Has seizures or another nervous system problem  Has ever had Guillain-Barr Syndrome (also called "GBS")  Has had severe pain or swelling after a previous dose of any vaccine that protects against tetanus or diphtheria In some cases, your health care provider may decide to postpone Tdap vaccination until a future visit. People with minor illnesses, such as a cold, may be vaccinated. People who are moderately or severely ill should usually wait until they recover before getting Tdap vaccine.  Your health care provider can give you more information. 4. Risks of a vaccine reaction  Pain, redness, or swelling where the shot was given, mild fever, headache, feeling tired, and nausea, vomiting, diarrhea, or stomachache sometimes happen after Tdap vaccination. People sometimes faint after medical procedures, including vaccination. Tell your provider if you feel dizzy or have vision changes or ringing in the ears.  As with any medicine, there is a very remote chance of a vaccine causing a severe allergic reaction, other serious injury, or death. 5. What if there is a serious problem? An allergic reaction could occur after the vaccinated person leaves the clinic. If you see signs of a severe allergic reaction (hives, swelling of the face and throat, difficulty breathing, a fast heartbeat, dizziness, or weakness), call 9-1-1 and get the person to the nearest hospital. For other signs that concern you, call your health care provider.  Adverse reactions should be reported to the Vaccine Adverse Event Reporting System (  VAERS). Your health care provider will usually file this report, or you can do it yourself. Visit the VAERS website at www.vaers.LAgents.no or call 210-844-5775. VAERS is  only for reporting reactions, and VAERS staff members do not give medical advice. 6. The National Vaccine Injury Compensation Program The [employer] Vaccine Injury Compensation Program (VICP) is a federal program that was created to compensate people who may have been injured by certain vaccines. Claims regarding alleged injury or death due to vaccination have a time limit for filing, which may be as short as two years. Visit the VICP website at SpiritualWord.at or call 971-383-7066 to learn about the program and about filing a claim. 7. How can I learn more?  Ask your health care provider.  Call your local or state health department.  Visit the website of the Food and Drug Administration (FDA) for vaccine package inserts and additional information at FinderList.no.  Contact the Centers for Disease Control and Prevention (CDC): ? Call 531 069 3312 (1-800-CDC-INFO) or ? Visit CDC's website at PicCapture.uy. Vaccine Information Statement Tdap (Tetanus, Diphtheria, Pertussis) Vaccine (05/02/2020) This information is not intended to replace advice given to you by your health care provider. Make sure you discuss any questions you have with your health care provider. Document Revised: 05/28/2020 Document Reviewed: 05/28/2020 Elsevier Patient Education  2021 Elsevier Inc. \   Diabetes Mellitus and Nutrition, Adult When you have diabetes, or diabetes mellitus, it is very important to have healthy eating habits because your blood sugar (glucose) levels are greatly affected by what you eat and drink. Eating healthy foods in the right amounts, at about the same times every day, can help you:  Control your blood glucose.  Lower your risk of heart disease.  Improve your blood pressure.  Reach or maintain a healthy weight. What can affect my meal plan? Every person with diabetes is different, and each person has different needs for a meal  plan. Your health care provider may recommend that you work with a dietitian to make a meal plan that is best for you. Your meal plan may vary depending on factors such as:  The calories you need.  The medicines you take.  Your weight.  Your blood glucose, blood pressure, and cholesterol levels.  Your activity level.  Other health conditions you have, such as heart or kidney disease. How do carbohydrates affect me? Carbohydrates, also called carbs, affect your blood glucose level more than any other type of food. Eating carbs naturally raises the amount of glucose in your blood. Carb counting is a method for keeping track of how many carbs you eat. Counting carbs is important to keep your blood glucose at a healthy level, especially if you use insulin or take certain oral diabetes medicines. It is important to know how many carbs you can safely have in each meal. This is different for every person. Your dietitian can help you calculate how many carbs you should have at each meal and for each snack. How does alcohol affect me? Alcohol can cause a sudden decrease in blood glucose (hypoglycemia), especially if you use insulin or take certain oral diabetes medicines. Hypoglycemia can be a life-threatening condition. Symptoms of hypoglycemia, such as sleepiness, dizziness, and confusion, are similar to symptoms of having too much alcohol.  Do not drink alcohol if: ? Your health care provider tells you not to drink. ? You are pregnant, may be pregnant, or are planning to become pregnant.  If you drink alcohol: ? Do not drink on an  empty stomach. ? Limit how much you use to:  0-1 drink a day for women.  0-2 drinks a day for men. ? Be aware of how much alcohol is in your drink. In the [place] one drink equals one 12 oz bottle of beer (355 mL), one 5 oz glass of wine (148 mL), or one 1 oz glass of hard liquor (44 mL). ? Keep yourself hydrated with water, diet soda, or unsweetened iced  tea.  Keep in mind that regular soda, juice, and other mixers may contain a lot of sugar and must be counted as carbs. What are tips for following this plan? Reading food labels  Start by checking the serving size on the "Nutrition Facts" label of packaged foods and drinks. The amount of calories, carbs, fats, and other nutrients listed on the label is based on one serving of the item. Many items contain more than one serving per package.  Check the total grams (g) of carbs in one serving. You can calculate the number of servings of carbs in one serving by dividing the total carbs by 15. For example, if a food has 30 g of total carbs per serving, it would be equal to 2 servings of carbs.  Check the number of grams (g) of saturated fats and trans fats in one serving. Choose foods that have a low amount or none of these fats.  Check the number of milligrams (mg) of salt (sodium) in one serving. Most people should limit total sodium intake to less than 2,300 mg per day.  Always check the nutrition information of foods labeled as "low-fat" or "nonfat." These foods may be higher in added sugar or refined carbs and should be avoided.  Talk to your dietitian to identify your daily goals for nutrients listed on the label. Shopping  Avoid buying canned, pre-made, or processed foods. These foods tend to be high in fat, sodium, and added sugar.  Shop around the outside edge of the grocery store. This is where you will most often find fresh fruits and vegetables, bulk grains, fresh meats, and fresh dairy. Cooking  Use low-heat cooking methods, such as baking, instead of high-heat cooking methods like deep frying.  Cook using healthy oils, such as olive, canola, or sunflower oil.  Avoid cooking with butter, cream, or high-fat meats. Meal planning  Eat meals and snacks regularly, preferably at the same times every day. Avoid going long periods of time without eating.  Eat foods that are high in  fiber, such as fresh fruits, vegetables, beans, and whole grains. Talk with your dietitian about how many servings of carbs you can eat at each meal.  Eat 4-6 oz (112-168 g) of lean protein each day, such as lean meat, chicken, fish, eggs, or tofu. One ounce (oz) of lean protein is equal to: ? 1 oz (28 g) of meat, chicken, or fish. ? 1 egg. ?  cup (62 g) of tofu.  Eat some foods each day that contain healthy fats, such as avocado, nuts, seeds, and fish.   What foods should I eat? Fruits Berries. Apples. Oranges. Peaches. Apricots. Plums. Grapes. Mango. Papaya. Pomegranate. Kiwi. Cherries. Vegetables Lettuce. Spinach. Leafy greens, including kale, chard, collard greens, and mustard greens. Beets. Cauliflower. Cabbage. Broccoli. Carrots. Green beans. Tomatoes. Peppers. Onions. Cucumbers. Brussels sprouts. Grains Whole grains, such as whole-wheat or whole-grain bread, crackers, tortillas, cereal, and pasta. Unsweetened oatmeal. Quinoa. Brown or wild rice. Meats and other proteins Seafood. Poultry without skin. Lean cuts of poultry  and beef. Tofu. Nuts. Seeds. Dairy Low-fat or fat-free dairy products such as milk, yogurt, and cheese. The items listed above may not be a complete list of foods and beverages you can eat. Contact a dietitian for more information. What foods should I avoid? Fruits Fruits canned with syrup. Vegetables Canned vegetables. Frozen vegetables with butter or cream sauce. Grains Refined white flour and flour products such as bread, pasta, snack foods, and cereals. Avoid all processed foods. Meats and other proteins Fatty cuts of meat. Poultry with skin. Breaded or fried meats. Processed meat. Avoid saturated fats. Dairy Full-fat yogurt, cheese, or milk. Beverages Sweetened drinks, such as soda or iced tea. The items listed above may not be a complete list of foods and beverages you should avoid. Contact a dietitian for more information. Questions to ask a health  care provider  Do I need to meet with a diabetes educator?  Do I need to meet with a dietitian?  What number can I call if I have questions?  When are the best times to check my blood glucose? Where to find more information:  American Diabetes Association: diabetes.org  Academy of Nutrition and Dietetics: www.eatright.[employer] of Diabetes and Digestive and Kidney Diseases: CarFlippers.tn  Association of Diabetes Care and Education Specialists: www.diabeteseducator.org Summary  It is important to have healthy eating habits because your blood sugar (glucose) levels are greatly affected by what you eat and drink.  A healthy meal plan will help you control your blood glucose and maintain a healthy lifestyle.  Your health care provider may recommend that you work with a dietitian to make a meal plan that is best for you.  Keep in mind that carbohydrates (carbs) and alcohol have immediate effects on your blood glucose levels. It is important to count carbs and to use alcohol carefully. This information is not intended to replace advice given to you by your health care provider. Make sure you discuss any questions you have with your health care provider. Document Revised: 08/21/2019 Document Reviewed: 08/21/2019 Elsevier Patient Education  2021 [employer].

## 2020-12-03 ENCOUNTER — Encounter: Payer: Managed Care, Other (non HMO) | Attending: Obstetrics

## 2020-12-03 ENCOUNTER — Ambulatory Visit: Payer: Managed Care, Other (non HMO) | Admitting: *Deleted

## 2020-12-03 ENCOUNTER — Encounter: Payer: Self-pay | Admitting: *Deleted

## 2020-12-03 ENCOUNTER — Ambulatory Visit (INDEPENDENT_AMBULATORY_CARE_PROVIDER_SITE_OTHER): Payer: Managed Care, Other (non HMO) | Admitting: Obstetrics and Gynecology

## 2020-12-03 ENCOUNTER — Other Ambulatory Visit: Payer: Self-pay

## 2020-12-03 VITALS — BP 125/75 | HR 66 | Wt 230.3 lb

## 2020-12-03 DIAGNOSIS — O3660X0 Maternal care for excessive fetal growth, unspecified trimester, not applicable or unspecified: Secondary | ICD-10-CM | POA: Insufficient documentation

## 2020-12-03 DIAGNOSIS — Z283 Underimmunization status: Secondary | ICD-10-CM | POA: Insufficient documentation

## 2020-12-03 DIAGNOSIS — Z6841 Body Mass Index (BMI) 40.0 and over, adult: Secondary | ICD-10-CM | POA: Insufficient documentation

## 2020-12-03 DIAGNOSIS — Z3492 Encounter for supervision of normal pregnancy, unspecified, second trimester: Secondary | ICD-10-CM | POA: Insufficient documentation

## 2020-12-03 DIAGNOSIS — O34219 Maternal care for unspecified type scar from previous cesarean delivery: Secondary | ICD-10-CM | POA: Insufficient documentation

## 2020-12-03 DIAGNOSIS — O26893 Other specified pregnancy related conditions, third trimester: Secondary | ICD-10-CM | POA: Insufficient documentation

## 2020-12-03 DIAGNOSIS — H53419 Scotoma involving central area, unspecified eye: Secondary | ICD-10-CM | POA: Insufficient documentation

## 2020-12-03 DIAGNOSIS — O99891 Other specified diseases and conditions complicating pregnancy: Secondary | ICD-10-CM | POA: Insufficient documentation

## 2020-12-03 DIAGNOSIS — Z6834 Body mass index (BMI) 34.0-34.9, adult: Secondary | ICD-10-CM

## 2020-12-03 DIAGNOSIS — O3663X0 Maternal care for excessive fetal growth, third trimester, not applicable or unspecified: Secondary | ICD-10-CM

## 2020-12-03 DIAGNOSIS — O99210 Obesity complicating pregnancy, unspecified trimester: Secondary | ICD-10-CM

## 2020-12-03 DIAGNOSIS — Z3A32 32 weeks gestation of pregnancy: Secondary | ICD-10-CM

## 2020-12-03 DIAGNOSIS — O99213 Obesity complicating pregnancy, third trimester: Secondary | ICD-10-CM | POA: Insufficient documentation

## 2020-12-03 DIAGNOSIS — Z3493 Encounter for supervision of normal pregnancy, unspecified, third trimester: Secondary | ICD-10-CM

## 2020-12-03 NOTE — Progress Notes (Signed)
Prenatal Visit Note Date: 12/03/2020 Clinic: [facility]  Subjective:  Shawna Curtis is a 30 y.o. [phone] at [redacted]w[redacted]d being seen today for ongoing prenatal care.  She is currently monitored for the following issues for this high-risk pregnancy and has Supervision of low-risk pregnancy; BMI 40.0-44.9, adult (HCC); History of cesarean delivery, currently pregnant; Rubella non-immune status, antepartum; Scotoma; Obesity in pregnancy; and LGA (large for gestational age) fetus affecting management of mother on their problem list.  Patient reports no complaints.   Contractions: Not present. Vag. Bleeding: None.  Movement: Present. Denies leaking of fluid.   The following portions of the patient's history were reviewed and updated as appropriate: allergies, current medications, past family history, past medical history, past social history, past surgical history and problem list. Problem list updated.  Objective:   Vitals:   12/03/20 1446  BP: 125/75  Pulse: 66  Weight: 230 lb 4.8 oz (104.5 kg)    Fetal Status: Fetal Heart Rate (bpm): 143   Movement: Present     General:  Alert, oriented and cooperative. Patient is in no acute distress.  Skin: Skin is warm and dry. No rash noted.   Cardiovascular: Normal heart rate noted  Respiratory: Normal respiratory effort, no problems with respiration noted  Abdomen: Soft, gravid, appropriate for gestational age. Pain/Pressure: Present     Pelvic:  Cervical exam deferred        Extremities: Normal range of motion.  Edema: None  Mental Status: Normal mood and affect. Normal behavior. Normal judgment and thought content.   Urinalysis:      Assessment and Plan:  Pregnancy: G4P1021 at [redacted]w[redacted]d  1. Encounter for supervision of low-risk pregnancy in third trimester Routine care Patient states her first ever u/s was done with her anatomy and it said her EDC was 4/22. Her LMP gives an EDC of 5/1. She states in [place] she was  told both as potential EDCs. I told her I recommend EDC 5/1 and this was changed today, which makes her 32wks and 3 days today  2. Obesity in pregnancy  3. History of cesarean delivery, currently pregnant 2018: primary, scheduled, non labored c/s at 38wks in [place] b/c she states she had an u/s the day before and they told her she had "cephalopelvic disproportion"  She was never told she had to have another c-section. I told her that based on what she tells me she is eligible for a TOLAC. I told her that we will keep a close watch on her labor curve and rpt growth u/s to see if they get close to 5000gm, which I doubt will be the case.  R/b d/w her and her husband and she desires a tolac. Consent signed today.   4. BMI 40.0-44.9, adult (HCC)  5. Excessive fetal growth affecting management of pregnancy in third trimester, single or unspecified fetus Follow up rpt u/s. Largest prior was 7lbs 11oz.  2/2: lga 1586gm, efw 94%, ac 97% . Normal 2h GTT and anatomy    Preterm labor symptoms and general obstetric precautions including but not limited to vaginal bleeding, contractions, leaking of fluid and fetal movement were reviewed in detail with the patient. Please refer to After Visit Summary for other counseling recommendations.  Return in about 2 weeks (around 12/17/2020).   [place] Bing, MD

## 2020-12-04 ENCOUNTER — Other Ambulatory Visit: Payer: Self-pay | Admitting: *Deleted

## 2020-12-04 DIAGNOSIS — O3663X0 Maternal care for excessive fetal growth, third trimester, not applicable or unspecified: Secondary | ICD-10-CM

## 2020-12-04 DIAGNOSIS — O3660X0 Maternal care for excessive fetal growth, unspecified trimester, not applicable or unspecified: Secondary | ICD-10-CM | POA: Insufficient documentation

## 2020-12-04 DIAGNOSIS — O99210 Obesity complicating pregnancy, unspecified trimester: Secondary | ICD-10-CM | POA: Insufficient documentation

## 2020-12-16 NOTE — Progress Notes (Signed)
   Subjective:  Shawna Curtis is a 30 y.o. [phone] at 74w2dbeing seen today for ongoing prenatal care.  She is currently monitored for the following issues for this high-risk pregnancy and has Supervision of low-risk pregnancy; [place] 40.0-44.9, [place]; History of cesarean delivery, currently pregnant; Rubella non-immune status, antepartum; Scotoma; Obesity in pregnancy; and LGA (large for gestational age) fetus affecting management of mother on their problem list.  Patient reports no complaints.  Contractions: Not present. Vag. Bleeding: None.  Movement: Present. Denies leaking of fluid.   The following portions of the patient's history were reviewed and updated as appropriate: allergies, current medications, past family history, past medical history, past social history, past surgical history and problem list. Problem list updated.  Objective:   Vitals:   12/17/20 1335  BP: 113/76  Pulse: 96  Weight: 232 lb 8 oz (105.5 kg)    Fetal Status: Fetal Heart Rate (bpm): 164   Movement: Present     General:  Alert, oriented and cooperative. Patient is in no acute distress.  Skin: Skin is warm and dry. No rash noted.   Cardiovascular: Normal heart rate noted  Respiratory: Normal respiratory effort, no problems with respiration noted  Abdomen: Soft, gravid, appropriate for gestational age. Pain/Pressure: Absent     Pelvic: Vag. Bleeding: None Vag D/C Character: White   Cervical exam deferred        Extremities: Normal range of motion.  Edema: None  Mental Status: Normal mood and affect. Normal behavior. Normal judgment and thought content.   Urinalysis:      Assessment and Plan:  Pregnancy: G4P1021 at 3101w2d1. Encounter for supervision of low-risk pregnancy in third trimester BP and FHR normal Many questions about dating, recommended we keep current dating. She will attempt to obtain first [place] she had irregular periods before, discussed that dating  would depend on deviation from her LMP Many questions answered regarding circumcision, they are still deciding  2. 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 Rubella non-immune status, antepartum MMR PP  4. Obesity in pregnancy   5. Excessive fetal growth affecting management of pregnancy in third trimester, single or unspecified fetus Last growth [place]/9 EFW 96%, 2830 g, follow up scheduled next month  6. History of cesarean delivery, currently pregnant TOLAC consent signed 12/03/2020  Preterm labor symptoms and general obstetric precautions including but not limited to vaginal bleeding, contractions, leaking of fluid and fetal movement were reviewed in detail with the patient. Please refer to After Visit Summary for other counseling recommendations.  Return in about 2 weeks (around 12/31/2020) [facility].   EcClarnce FlockMD

## 2020-12-17 ENCOUNTER — Ambulatory Visit (INDEPENDENT_AMBULATORY_CARE_PROVIDER_SITE_OTHER): Payer: Managed Care, Other (non HMO) | Admitting: Family Medicine

## 2020-12-17 ENCOUNTER — Ambulatory Visit: Payer: Managed Care, Other (non HMO) | Attending: Nurse Practitioner | Admitting: Physical Therapy

## 2020-12-17 ENCOUNTER — Encounter: Payer: Self-pay | Admitting: Physical Therapy

## 2020-12-17 ENCOUNTER — Encounter: Payer: Self-pay | Admitting: Family Medicine

## 2020-12-17 ENCOUNTER — Other Ambulatory Visit: Payer: Self-pay

## 2020-12-17 VITALS — BP 113/76 | HR 96 | Wt 232.5 lb

## 2020-12-17 DIAGNOSIS — O99210 Obesity complicating pregnancy, unspecified trimester: Secondary | ICD-10-CM

## 2020-12-17 DIAGNOSIS — O09899 Supervision of other high risk pregnancies, unspecified trimester: Secondary | ICD-10-CM

## 2020-12-17 DIAGNOSIS — M545 Low back pain, unspecified: Secondary | ICD-10-CM | POA: Insufficient documentation

## 2020-12-17 DIAGNOSIS — O3663X0 Maternal care for excessive fetal growth, third trimester, not applicable or unspecified: Secondary | ICD-10-CM

## 2020-12-17 DIAGNOSIS — O99891 Other specified diseases and conditions complicating pregnancy: Secondary | ICD-10-CM

## 2020-12-17 DIAGNOSIS — M6281 Muscle weakness (generalized): Secondary | ICD-10-CM | POA: Diagnosis present

## 2020-12-17 DIAGNOSIS — M62830 Muscle spasm of back: Secondary | ICD-10-CM | POA: Diagnosis present

## 2020-12-17 DIAGNOSIS — G8929 Other chronic pain: Secondary | ICD-10-CM | POA: Diagnosis present

## 2020-12-17 DIAGNOSIS — Z3493 Encounter for supervision of normal pregnancy, unspecified, third trimester: Secondary | ICD-10-CM

## 2020-12-17 DIAGNOSIS — Z2839 Other underimmunization status: Secondary | ICD-10-CM

## 2020-12-17 DIAGNOSIS — O34219 Maternal care for unspecified type scar from previous cesarean delivery: Secondary | ICD-10-CM

## 2020-12-17 DIAGNOSIS — Z283 Underimmunization status: Secondary | ICD-10-CM

## 2020-12-17 DIAGNOSIS — H53412 Scotoma involving central area, left eye: Secondary | ICD-10-CM

## 2020-12-17 NOTE — Therapy (Addendum)
[facility] [address], [place], [place], [address] Phone: [phone]   Fax:  [phone]  Physical Therapy Evaluation  Patient Details  Name: Shawna Curtis MRN: [id number] Date of Birth: [birth year] Referring Provider (PT): Earlie Server, NP   Encounter Date: 12/17/2020   PT End of Session - 12/17/20 1518    Visit Number 1    Date for PT Re-Evaluation 02/11/21    Authorization Type [employer]    PT Start Time 1520    PT Stop Time 1605    PT Time Calculation (min) 45 min    Activity Tolerance Patient tolerated treatment well    Behavior During Therapy [facility] for tasks assessed/performed           Past Medical History:  Diagnosis Date  . Dyspnea   . GERD (gastroesophageal reflux disease)     Past Surgical History:  Procedure Laterality Date  . CESAREAN SECTION      There were no vitals filed for this visit.    Subjective Assessment - 12/17/20 1521    Subjective Patient reports she has back pain. She moved from [place] 3 months ago. Patient has had off and on back pain and getting worse with her pregnancy. Had physical therapy in [place].    Patient Stated Goals work on back pain,    Currently in Pain? Yes    Pain Score 7     Pain Location Back    Pain Orientation Lower    Pain Descriptors / Indicators Dull;Sore    Pain Type Chronic pain    Pain Frequency Intermittent    Aggravating Factors  unable to carry items; roll in bed in the morning, when get up will have difficulty with the initial step; standing too long    Pain Relieving Factors lay on back    Multiple Pain Sites No              [facility] PT Assessment - 12/17/20 0001      Assessment   Medical Diagnosis [id number], R10.2 Pelvic pain affecting pregnancy, antepartum    Referring Provider (PT) Earlie Server, NP    Prior Therapy prior PT in [place]      Precautions   Precautions Other (comment)    Precaution Comments due date is  4/22-5/1      Restrictions   Weight Bearing Restrictions No      Balance Screen   Has the patient fallen in the past 6 months No    Has the patient had a decrease in activity level because of a fear of falling?  No    Is the patient reluctant to leave their home because of a fear of falling?  No      Home Ecologist residence      Prior Function   Level of Independence Independent    Leisure stay at home      Cognition   Overall Cognitive Status Within Functional Limits for tasks assessed      Posture/Postural Control   Posture/Postural Control Postural limitations    Posture Comments pregnant posture      ROM / Strength   AROM / PROM / Strength AROM;PROM;Strength      AROM   Lumbar Extension decreased by 50%    Lumbar - Right Rotation decreased by 25%    Lumbar - Left Rotation decreased by 25%      Strength   Right Hip ABduction 4-/5  Objective measurements completed on examination: See above findings.     Pelvic Floor Special Questions - 12/17/20 0001    Prior Pregnancies Yes    Number of Pregnancies 2   presently pregnant   Number of C-Sections 1    Currently Sexually Active Yes            [facility] PT Treatment/Exercise - 12/17/20 0001      Therapeutic Activites    Therapeutic Activities ADL's    ADL's log rolling in bed with beath, sit to stand with hip flexion and breathing out      Lumbar Exercises: Sidelying   Other Sidelying Lumbar Exercises sidely iliacus pull back 10x on each side      Manual Therapy   Manual Therapy Soft tissue mobilization;Joint mobilization    Joint Mobilization gapping of the lumbar facets    Soft tissue mobilization using the addaday to the lumbar, gluteals, SI joint and quadratus in sidely                  PT Education - 12/17/20 1610    Education Details how to move in bed with spinal neutral and breath; massage with tennis ball and massage gun;  sit to stand with breath and spinal neutral    Person(s) Educated Patient    Methods Explanation;Demonstration;Handout    Comprehension Verbalized understanding;Returned demonstration            PT Short Term Goals - 12/17/20 1621      PT SHORT TERM GOAL #1   Title Independent with initial stretches for her back    Time 4    Period Weeks    Status New    Target Date 01/14/21      PT SHORT TERM GOAL #2   Title understand C-section scar mobilization prior to birth and afterwards incase she has another    Time 4    Period Weeks    Status New    Target Date 01/14/21             PT Long Term Goals - 12/17/20 1622      PT LONG TERM GOAL #1   Title educated on different birthing positions to not strain her back  and how to move the baby through the different stages    Time 8    Period Weeks    Status New    Target Date 02/11/21      PT LONG TERM GOAL #2   Title back pain with daily activities decreased >/= 50% due to increased tissue and spinal mobility    Time 8    Period Weeks    Status New    Target Date 02/11/21      PT LONG TERM GOAL #3   Title understand correct body mechanics with daily tasks to reduce strain on lumbar while her pregnancy progresses    Time 8    Period Weeks    Status New    Target Date 01/14/21                  Plan - 12/17/20 1519    Clinical Impression Statement Patient is a 30 year old female with chronic back pain and [redacted] weeks pregnant. Patient reports her back pain is 7/10 intermittently with movement, lifting, move in bed, and when she initially gets up. Lumbar ROM limited by 25% for rotation and 50% extension. Tightness in the low thoracic and lumbar paraspinals. Decreased movement of T11-L5. Tenderness  located in the lumbar with right worse than the left. C-section scar has decreased mobility. Right hip abduction 4/5. Patient is due between 01/14/21-01/25/2021. Patient would like to try for vaginal birth.  She is interested in  using a physioball. Patient will benefit from skilled therapy to improve mobility and decrease pain for daily tasks and during child birth.    Personal Factors and Comorbidities Comorbidity 1;Fitness;Past/Current Experience    Comorbidities Presently 34 months along in prgenancy; previous C-section 4 years ago    Examination-Activity Limitations Bed Mobility;Locomotion Level;Caring for Others;Lift;Stand;Sleep    Examination-Participation Restrictions Meal Prep;Cleaning    Stability/Clinical Decision Making Stable/Uncomplicated    Clinical Decision Making Low    Rehab Potential Excellent    PT Frequency 1x / week    PT Duration 8 weeks    PT Treatment/Interventions ADLs/Self Care Home Management;Cryotherapy;Moist Heat;Therapeutic activities;Therapeutic exercise;Neuromuscular re-education;Patient/family education;Manual techniques;Scar mobilization;Taping;Spinal Manipulations    PT Next Visit Plan manual mobilization to lumbar, manual soft tissue work to lumbar and lower rib cage mobility; stretched for back in sitting and quadruped; body mechanics with daily tasks; perineal massage at 35 weaks to reduce tearing with vaginal birth, standing with pelvic tilt to reduce strain on back, sidely thoracic rotation    Consulted and Agree with Plan of Care Patient           Patient will benefit from skilled therapeutic intervention in order to improve the following deficits and impairments:  Decreased range of motion,Increased fascial restricitons,Decreased endurance,Increased muscle spasms,Pain,Decreased activity tolerance,Decreased strength,Decreased scar mobility  Visit Diagnosis: Chronic bilateral low back pain without sciatica - Plan: PT plan of care cert/re-cert  Muscle weakness (generalized) - Plan: PT plan of care cert/re-cert  Muscle spasm of back - Plan: PT plan of care cert/re-cert     Problem List Patient Active Problem List   Diagnosis Date Noted  . Obesity in pregnancy 12/04/2020   . LGA (large for gestational age) fetus affecting management of mother 12/04/2020  . Rubella non-immune status, antepartum 11/04/2020  . Scotoma 11/04/2020  . Supervision of low-risk pregnancy 10/28/2020  . BMI 40.0-44.9, adult ([place]) 10/28/2020  . History of cesarean delivery, currently pregnant 10/28/2020    Earlie Counts, PT 12/17/20 4:27 PM   [facility] [address] [place], [place], [address] Phone: [phone]   Fax:  [phone]  Name: Shawna Curtis MRN: [id number] Date of Birth: [birth year] PHYSICAL THERAPY DISCHARGE SUMMARY  Visits from Start of Care: 1  Current functional level related to goals / functional outcomes: See above. Patient called and wanted to be discharged from physical therapy and wait till after she delivers the baby.    Remaining deficits: See above.    Education / Equipment: HEP Plan: Patient agrees to discharge.  Patient goals were not met. Patient is being discharged due to the patient's request.  Thank you for the referral. Earlie Counts, PT 12/23/20 12:21 PM  ?????

## 2020-12-17 NOTE — Patient Instructions (Signed)
AREA PEDIATRIC/FAMILY PRACTICE PHYSICIANS  [place] ([address]) . [facility] o Chambliss, MD; Eniola, MD; Hale, MD; Hensel, MD; McDiarmid, MD; McIntyer, MD; Kaliann Coryell, MD; Walden, MD o [address] [place], [place] [address] o [phone] o Mon-Fri 8:30-12:30, 1:30-5:00 o Providers come to see babies at [facility] o Accepting Medicaid . [facility] o Limited providers who accept newborns: Koirala, MD; Morrow, MD; Wolters, MD o [address], [place], [place] [address] o [phone] o Mon-Fri 8:00-5:30 o Babies seen by providers at [facility] o Does NOT accept Medicaid o Please call early in hospitalization for appointment (limited availability)  . [facility] o Mulberry, MD o [address] [place], [place] [address] o [phone] o Mon, Tue, Thur, Fri 8:30-5:00, Wed 10:00-7:00 (closed 1-2pm) o Babies seen by [facility] providers o Accepting Medicaid . [facility] o Rubin, MD o [address], [place], [place] [address] o [phone] o Mon-Fri 8:30-5:00, Sat 8:30-12:00 o Provider comes to see babies at [facility] o Accepting Medicaid o Must have been referred from current patients or contacted office prior to delivery . [facility] ([facility]) o Brown, MD; Chandler, MD; Ettefagh, MD; Grant, MD; Lester, MD; McCormick, MD; McQueen, MD; Prose, MD; Simha, MD; Stanley, MD; Stryffeler, NP; Tebben, NP o [address], [place], [place] [address] o [phone] o Mon, Tue, Thur, Fri 8:30-5:30, Wed 9:30-5:30, Sat 8:30-12:30 o Babies seen by [facility] providers o Accepting Medicaid o Only accepting infants of first-time parents or siblings of current patients o Hospital discharge coordinator will make follow-up appointment . Jack Amos o [address],  [place], [place]  [address] o [phone]   Fax - [phone] . [facility] o [address], [place], [place]  [address] o Phone - [phone]   Fax - [phone] . Shilpa Gosrani o [address], [place], [place]  [address] o [phone]  [place] ([address]) . [facility] o Bates, MD; [place], MD; Cooper, Cox, MD; MD; Davis, MD; Dovico, MD; Ettefaugh, MD; Little, MD; Lowe, MD; Keiffer, MD; Melvin, MD; Sumner, MD; Williams, MD o [address], [place], [place] [address] o [phone] o Mon-Fri 8:30-5:00 (extended evenings Mon-Thur as needed), Sat-Sun 10:00-1:00 o Providers come to see babies at [facility] o Accepting Medicaid for families of first-time babies and families with all children in the household age 3 and under. Must register with office prior to making appointment (M-F only). . [facility] o Henson, NP; Knapp, MD; Lalonde, MD; Tysinger, PA o [address] [place], [place] [address] o [phone] o Mon-Fri 8:00-5:00 o Babies seen by providers at [facility] o Does NOT accept Medicaid/Commercial Insurance Only . [facility] ([facility]  o Artis, MD; Barnes, MD; Bratton, MD; Coccaro, MD; Lockett Gardner, MD; Kramer, MD; Marshall, MD; Netherton, MD; Poleto, MD; Skinner, MD o [address] [place], [place] [address] o [phone] o Mon-Fri 8:30-5:30, Sat (Oct.-Mar.) 9:00-1:00 o Babies seen by providers at [facility] o Accepting Medicaid  [place] ([address]) . [facility] o Reid, MD; Warner, MD o [address], [place], [place] [address] o [phone] o Mon-Fri 8:30-5:00, Sat 8:30-12:00 o Providers come to see babies at [facility] o Does NOT accept Medicaid . [facility] o Becker, PA; Hagler, MD; Scifres, PA; Sun, MD; Swayne, MD o [address],  [place], [place] [address] o [phone] o Mon-Fri 8:00-5:00 o Babies seen by providers at [facility] o Does NOT accept Medicaid o Only accepting babies of parents who are patients o Please call early in hospitalization for appointment (limited availability) . [facility] o Clark, MD; Frye, MD; Kelleher, MD; Mack, NP; Miller, MD; O'Keller, MD; Patterson, NP; Pudlo, MD; Puzio, MD; Thomas, MD; Tucker, MD; Twiselton, MD o [address], [place], [place] [address] o [phone] o Mon-Fri 8:00-5:00, Sat 9:00-12:00 o Providers come to see babies at [facility] o Does NOT accept Medicaid  [place] ([address]) . [facility] o Limited providers accepting new patients: Brake, NP; Wharton, PA o [address], [place], [place] [address] o [phone] o Mon-Fri 8:00-5:00 o Babies seen by providers at [facility] o Does NOT accept Medicaid o Only accepting babies of parents who are patients o Please call early in hospitalization for appointment (limited availability) . [facility] o Gay, MD; Quinlan, MD o [address] [place], [place] [address] o [phone] (press 1 to schedule appointment) o Mon-Fri 8:00-5:00 o Providers come to see babies at [facility] o Does NOT accept Medicaid . [facility] o Mazer, MD o [address] [place], [place] [address] o [phone] o Mon-Fri 8:30-5:00 (lunch 12:30-1:00), extended hours by appointment only Wed 5:00-6:30 o Babies seen by [facility] providers o Accepting Medicaid . [facility] o Banks, MD; Jordan, MD; Koberlein, MD o [address], [place], [place] [address] o [phone] o Mon-Fri 8:00-5:00 o Babies seen by [facility] providers o Does NOT accept Medicaid . [facility] o Parker, MD; Hunter, MD; Wallace, DO o [address] [place], [place]  [address] o [phone] o Mon-Fri 8:00-5:00 o Babies seen by [facility] providers o Does NOT accept Medicaid . [facility] o Brandon, PA; Brecken, PA; Christy, NP; Dees, MD; DeClaire, MD; DeWeese, MD; Hansen, NP; Mills, NP; Parrish, NP; Smoot, NP; Summer, MD; Vapne, MD o [address] [place], [place] [address] o [phone] o Mon-Fri 8:30-5:00, Sat 10:00-1:00 o Providers come to see babies at [facility] o Does NOT accept Medicaid o Free prenatal information session Tuesdays at 4:45pm . [facility] o Bouska, MD; Gordon, PA; Jeffery, PA; Weber, PA o [address] [place] [address] o [phone] o Mon-Fri 7:30-5:30 o Babies seen by [facility] providers . [facility] o [address], [place], [place]  [address] o [phone]   Fax - [phone]  [place] ([address] & [address]) . [facility] o [place], [place] o [address] [place], [place] [address] o [phone] o Mon-Thur 8:00-6:00 o Providers come to see babies at [facility] o Accepting Medicaid . [facility] o Anderson, NP; Badger, MD; Beal, PA; Spencer, PA o [address] [place], [place] [address] o [phone] o Mon-Thur 7:30-7:30, Fri 7:30-4:30 o Babies seen by [facility] providers o Accepting Medicaid . [facility] o Agbuya, MD; Klett, NP; Romgoolam, MD o [address], ,  [address] o [phone] o Mon-Fri 8:30-5:00, Sat 8:30-12:00 o Providers come to see babies at [facility] o Accepting Medicaid o Must have "Meet & Greet" appointment at office prior to delivery . [facility]  [facility] o McCord,   MD; Wallace, MD; Wood, MD o [address], [place], [place] [address] o [phone] o Mon-Wed 8:00-6:00, Thur-Fri 8:00-5:00, Sat 9:00-12:00 o Providers come to  see babies at [facility] o Does NOT accept Medicaid o Only accepting siblings of current patients . [facility]  o [address], [place], [place]  [address] o [phone]   Fax - [phone] . [facility] o [address], [place], [place]  [address] o [phone]   Fax - [phone]  [place] ([address] & [address]) . [facility] o Cirigliano, DO; Matthews, DO o [address] [place], [place] [address] o [phone] o Mon-Fri 7:00-5:00 o Babies seen by [facility] providers o Does NOT accept Medicaid . [facility] o Briscoe, MD; Howley, PA; Moreira, PA o [address], [place], [place] [address] o [phone] o Mon-Fri 8:00-5:00 o Babies seen by [facility] providers o Accepting Medicaid . [facility] o Boyd, MD; Church, PA; Jones, NP; Osborn, PA o [address], [place], [place] [address] o [phone] o Mon-Fri 8:00-5:00 o Babies seen by providers at [facility] o Accepting Medicaid  [place] ([address]) .  [facility] [place] o Wendling, DO o [address] [place], [place] [address] o [phone] o Mon-Fri 8:00-5:00 o Babies seen by [facility] providers o Does NOT accept Medicaid o Limited availability, please call early in hospitalization to schedule follow-up . [facility] o Calderon, PA; Cummings, MD; Dillard, MD; Martin, PA; Olson, MD; VanDeven, PA o [address], [place], [place] [address] o [phone] o Mon-Fri 8:30-5:00, Sat 9:00-12:00 o Babies seen by providers at [facility] o Accepting Medicaid o Please register online then schedule online or call office o [email or web address] . [facility] o Hunter, NP; Kumar, MD; Martin Rogers, PA o [address], [place], [place] [address] o [phone] o Mon-Fri 8:00-5:00 o Babies seen by providers at [facility] o Accepting Medicaid . [facility] o [place], MD; Kristi Fleenor, NP; West, MD o [address], [place], [place] [address] o [phone] o Mon-Fri 8:00-5:30, Sat&Sun by appointment (phones open at 8:30) o Babies seen by [facility] providers o Accepting Medicaid o Must be a first-time baby or sibling of current patient . [facility] [place]  o [address], [place], [place]  [address] o [phone]   Fax - [phone]  [place] ([address]) . [facility] o Brown, PA; Cowen, PA; Rice, MD; Helton, PA; Spry, MD o [address] [place], [place] [address] o [phone] o Mon-Thur 8:00-7:00, Fri 8:00-5:00, Sat 8:00-12:00, Sun 9:00-12:00 o Babies seen by [facility] providers o Accepting Medicaid . [facility] [place] o Coe-Goins, MD; Marshall, MD; Pierre-Louis, MD o [address] [place] [address], [place], [place] [address] o [phone] o Mon-Thur 8:00-5:00 o Babies seen by providers at [facility] o Accepting Medicaid . [facility] o Bratton, MD; Coe-Goins, MD; Hayes, MD; Lewis, MD; List, MD; Lott, MD; Marshall, MD; Moran, MD; O'Latron Ribas, MD; Pierre-Louis, MD; Pitonzo, MD; Scholer, MD; Spangle, MD o [address] [place], [place]   [address] o [phone] o Mon-Fri 8:00-5:30, Sat (Oct.-Mar.) 9:00-1:00 o Babies seen by providers at [facility] o Accepting Medicaid o Must fill out new patient packet, available online at [email or web address] . [facility] ([facility] o Friddle, NP; Harris, NP; Kelly, NP; Logan, MD;  Melvin, PA; Poth, MD; Ramadoss, MD; Stanton, NP o [address], [place], [place] [address] o [phone] o Mon-Thur 8:00-5:30, Fri 8:00-5:00 o Babies seen by providers at [facility] o Accepting Medicaid  [place] ([address]) . [facility] o Dixon, PA; [place], MD; Pickard, MD; Tapia, PA o [address], [place], [place] [address] o [phone] o Mon-Fri 8:00-5:00 o Babies seen by providers at [facility] o Accepting Medicaid   [place] ([address]) . [facility] [place] o Masneri, DO; Meyers, MD; Nelson, PA o [address], [place], [place] [address] o [phone] o Mon-Fri 8:00-5:00 o Babies seen by providers at [facility] o Does NOT accept Medicaid o Limited appointment availability, please call early in hospitalization  . [facility] [place] o Kunedd, DO; McGowen, MD o [address], [place], [place] [address] o [phone] o Mon-Fri 8:00-5:00 o Babies seen by [facility] providers o Does NOT accept Medicaid . [facility] [place] o Cameron, MD; MacDonald, MD; Michaels, PA; Nayak, MD o [address] [place] [address], [place], [place] [address] o [phone] o Mon-Fri 8:00-5:00 o After hours clinic ([address] [place], [place] [address]) [phone] Mon-Fri 5:00-8:00, Sat 12:00-6:00, Sun 10:00-4:00 o Babies seen by [facility] providers o Accepting Medicaid . [facility] [place] o [address], [place], [place]  [address] o [phone]   Fax - [phone]  [place] ([address]) . [facility] [place] o Andy, MD o [address], [place], [place] [address] o [phone] o Mon-Fri 8:00-5:00 o Babies seen by [facility] providers o Does NOT accept Medicaid . [facility] [place] ([facility] [place]) o Eksir, MD o [address], [place], [place]  [address] o [phone] o Mon-Thur 8:00-7:00, Fri 8:00-5:00, Sat 8:00-12:00 o Babies seen by providers at [facility] o Accepting Medicaid - but does not have vaccinations in office (must be received elsewhere) o Limited availability, please call early in hospitalization  [place] ([address]) . [facility]  o Charlene Flemming, MD o [address], [place] [address] o [phone]  Fax [phone]   

## 2020-12-29 ENCOUNTER — Telehealth: Payer: Self-pay | Admitting: Family Medicine

## 2020-12-30 NOTE — Telephone Encounter (Signed)
error 

## 2020-12-31 ENCOUNTER — Ambulatory Visit (INDEPENDENT_AMBULATORY_CARE_PROVIDER_SITE_OTHER): Payer: Managed Care, Other (non HMO) | Admitting: Family Medicine

## 2020-12-31 ENCOUNTER — Other Ambulatory Visit (HOSPITAL_COMMUNITY)
Admission: RE | Admit: 2020-12-31 | Discharge: 2020-12-31 | Disposition: A | Discharge status: Home / Self Care | Payer: Managed Care, Other (non HMO) | Source: Ambulatory Visit | Attending: Family Medicine | Admitting: Family Medicine

## 2020-12-31 ENCOUNTER — Other Ambulatory Visit: Payer: Self-pay

## 2020-12-31 ENCOUNTER — Encounter: Payer: Self-pay | Admitting: Family Medicine

## 2020-12-31 VITALS — BP 112/68 | HR 97 | Wt 232.0 lb

## 2020-12-31 DIAGNOSIS — Z3493 Encounter for supervision of normal pregnancy, unspecified, third trimester: Secondary | ICD-10-CM | POA: Diagnosis not present

## 2020-12-31 DIAGNOSIS — O09899 Supervision of other high risk pregnancies, unspecified trimester: Secondary | ICD-10-CM

## 2020-12-31 DIAGNOSIS — O3663X0 Maternal care for excessive fetal growth, third trimester, not applicable or unspecified: Secondary | ICD-10-CM

## 2020-12-31 DIAGNOSIS — O34219 Maternal care for unspecified type scar from previous cesarean delivery: Secondary | ICD-10-CM

## 2020-12-31 DIAGNOSIS — Z2839 Other underimmunization status: Secondary | ICD-10-CM

## 2020-12-31 DIAGNOSIS — O0990 Supervision of high risk pregnancy, unspecified, unspecified trimester: Secondary | ICD-10-CM

## 2020-12-31 NOTE — Patient Instructions (Signed)

## 2020-12-31 NOTE — Progress Notes (Signed)
   PRENATAL VISIT NOTE  Subjective:  Shawna Curtis is a 30 y.o. [phone] at [redacted]w[redacted]d being seen today for ongoing prenatal care.  She is currently monitored for the following issues for this high-risk pregnancy and has Supervision of high risk pregnancy, antepartum; BMI 40.0-44.9, adult ([place]); History of cesarean delivery, currently pregnant; Rubella non-immune status, antepartum; [place]; Obesity in pregnancy; and LGA (large for gestational age) fetus affecting management of mother on their problem list.  Patient reports pelvic pain.  Contractions: Irritability. Vag. Bleeding: None.  Movement: Present. Denies leaking of fluid.   The following portions of the patient's history were reviewed and updated as appropriate: allergies, current medications, past family history, past medical history, past social history, past surgical history and problem list.   Objective:   Vitals:   12/31/20 1314  BP: 112/68  Pulse: 97  Weight: 232 lb (105.2 kg)    Fetal Status: Fetal Heart Rate (bpm): 147 Fundal Height: 34 cm Movement: Present  Presentation: Vertex  General:  Alert, oriented and cooperative. Patient is in no acute distress.  Skin: Skin is warm and dry. No rash noted.   Cardiovascular: Normal heart rate noted  Respiratory: Normal respiratory effort, no problems with respiration noted  Abdomen: Soft, gravid, appropriate for gestational age.  Pain/Pressure: Present     Pelvic: Cervical exam performed in the presence of a chaperone Dilation: Closed Effacement (%): Thick Station: Ballotable  Extremities: Normal range of motion.  Edema: None  Mental Status: Normal mood and affect. Normal behavior. Normal judgment and thought content.   Assessment and Plan:  Pregnancy: [id number] at [redacted]w[redacted]d 1. Rubella non-immune status, antepartum Needs MMR pp  2. History of cesarean delivery, currently pregnant For TOLAC pending most recent EFW  3. Excessive fetal growth affecting management of pregnancy  in third trimester, single or unspecified fetus For growth tomorrow Consider IOL at 39 wks  4. Supervision of high risk pregnancy, antepartum Cultures today - GC/Chlamydia probe amp ([place])not at [facility] - Culture, beta strep (group b only)  Preterm labor symptoms and general obstetric precautions including but not limited to vaginal bleeding, contractions, leaking of fluid and fetal movement were reviewed in detail with the patient. Please refer to After Visit Summary for other counseling recommendations.   Return in 1 week (on 01/07/2021) for [facility], in person.  Future Appointments  Date Time Provider [place]  01/01/2021 10:45 AM [facility] NURSE [facility]  01/01/2021 11:00 AM [facility]    Donnamae Jude, MD

## 2021-01-01 ENCOUNTER — Encounter: Payer: Self-pay | Admitting: *Deleted

## 2021-01-01 ENCOUNTER — Ambulatory Visit: Payer: Managed Care, Other (non HMO) | Admitting: *Deleted

## 2021-01-01 ENCOUNTER — Ambulatory Visit: Payer: Managed Care, Other (non HMO) | Attending: Obstetrics

## 2021-01-01 DIAGNOSIS — O0990 Supervision of high risk pregnancy, unspecified, unspecified trimester: Secondary | ICD-10-CM | POA: Diagnosis present

## 2021-01-01 DIAGNOSIS — O3663X0 Maternal care for excessive fetal growth, third trimester, not applicable or unspecified: Secondary | ICD-10-CM | POA: Diagnosis not present

## 2021-01-01 DIAGNOSIS — Z6841 Body Mass Index (BMI) 40.0 and over, adult: Secondary | ICD-10-CM | POA: Diagnosis present

## 2021-01-01 DIAGNOSIS — O34219 Maternal care for unspecified type scar from previous cesarean delivery: Secondary | ICD-10-CM

## 2021-01-01 DIAGNOSIS — Z3A36 36 weeks gestation of pregnancy: Secondary | ICD-10-CM | POA: Diagnosis not present

## 2021-01-01 LAB — GC/CHLAMYDIA PROBE AMP (~~LOC~~) NOT AT ARMC
Chlamydia: NEGATIVE
Comment: NEGATIVE
Comment: NORMAL
Neisseria Gonorrhea: NEGATIVE

## 2021-01-04 LAB — CULTURE, BETA STREP (GROUP B ONLY): Strep Gp B Culture: NEGATIVE

## 2021-01-07 ENCOUNTER — Encounter: Payer: Self-pay | Admitting: Family Medicine

## 2021-01-07 ENCOUNTER — Other Ambulatory Visit: Payer: Self-pay

## 2021-01-07 ENCOUNTER — Encounter: Payer: Managed Care, Other (non HMO) | Admitting: Physical Therapy

## 2021-01-07 ENCOUNTER — Ambulatory Visit (INDEPENDENT_AMBULATORY_CARE_PROVIDER_SITE_OTHER): Payer: Managed Care, Other (non HMO) | Admitting: Family Medicine

## 2021-01-07 VITALS — BP 105/69 | HR 106 | Wt 237.6 lb

## 2021-01-07 DIAGNOSIS — O34219 Maternal care for unspecified type scar from previous cesarean delivery: Secondary | ICD-10-CM

## 2021-01-07 DIAGNOSIS — Z2839 Other underimmunization status: Secondary | ICD-10-CM

## 2021-01-07 DIAGNOSIS — O0990 Supervision of high risk pregnancy, unspecified, unspecified trimester: Secondary | ICD-10-CM

## 2021-01-07 DIAGNOSIS — O3663X0 Maternal care for excessive fetal growth, third trimester, not applicable or unspecified: Secondary | ICD-10-CM

## 2021-01-07 DIAGNOSIS — H53412 Scotoma involving central area, left eye: Secondary | ICD-10-CM

## 2021-01-07 NOTE — Patient Instructions (Signed)
 Contraception Choices Contraception, also called birth control, refers to methods or devices that prevent pregnancy. Hormonal methods Contraceptive implant A contraceptive implant is a thin, plastic tube that contains a hormone that prevents pregnancy. It is different from an intrauterine device (IUD). It is inserted into the upper part of the arm by a health care provider. Implants can be effective for up to 3 years. Progestin-only injections Progestin-only injections are injections of progestin, a synthetic form of the hormone progesterone. They are given every 3 months by a health care provider. Birth control pills Birth control pills are pills that contain hormones that prevent pregnancy. They must be taken once a day, preferably at the same time each day. A prescription is needed to use this method of contraception. Birth control patch The birth control patch contains hormones that prevent pregnancy. It is placed on the skin and must be changed once a week for three weeks and removed on the fourth week. A prescription is needed to use this method of contraception. Vaginal ring A vaginal ring contains hormones that prevent pregnancy. It is placed in the vagina for three weeks and removed on the fourth week. After that, the process is repeated with a new ring. A prescription is needed to use this method of contraception. Emergency contraceptive Emergency contraceptives prevent pregnancy after unprotected sex. They come in pill form and can be taken up to 5 days after sex. They work best the sooner they are taken after having sex. Most emergency contraceptives are available without a prescription. This method should not be used as your only form of birth control.   Barrier methods Female condom A female condom is a thin sheath that is worn over the penis during sex. Condoms keep sperm from going inside a woman's body. They can be used with a sperm-killing substance (spermicide) to increase their  effectiveness. They should be thrown away after one use. Female condom A female condom is a soft, loose-fitting sheath that is put into the vagina before sex. The condom keeps sperm from going inside a woman's body. They should be thrown away after one use. Diaphragm A diaphragm is a soft, dome-shaped barrier. It is inserted into the vagina before sex, along with a spermicide. The diaphragm blocks sperm from entering the uterus, and the spermicide kills sperm. A diaphragm should be left in the vagina for 6-8 hours after sex and removed within 24 hours. A diaphragm is prescribed and fitted by a health care provider. A diaphragm should be replaced every 1-2 years, after giving birth, after gaining more than 15 lb (6.8 kg), and after pelvic surgery. Cervical cap A cervical cap is a round, soft latex or plastic cup that fits over the cervix. It is inserted into the vagina before sex, along with spermicide. It blocks sperm from entering the uterus. The cap should be left in place for 6-8 hours after sex and removed within 48 hours. A cervical cap must be prescribed and fitted by a health care provider. It should be replaced every 2 years. Sponge A sponge is a soft, circular piece of polyurethane foam with spermicide in it. The sponge helps block sperm from entering the uterus, and the spermicide kills sperm. To use it, you make it wet and then insert it into the vagina. It should be inserted before sex, left in for at least 6 hours after sex, and removed and thrown away within 30 hours. Spermicides Spermicides are chemicals that kill or block sperm from entering the   cervix and uterus. They can come as a cream, Aleira, suppository, foam, or tablet. A spermicide should be inserted into the vagina with an applicator at least 10-15 minutes before sex to allow time for it to work. The process must be repeated every time you have sex. Spermicides do not require a prescription.   Intrauterine  contraception Intrauterine device (IUD) An IUD is a T-shaped device that is put in a woman's uterus. There are two types:  Hormone IUD.This type contains progestin, a synthetic form of the hormone progesterone. This type can stay in place for 3-5 years.  Copper IUD.This type is wrapped in copper wire. It can stay in place for 10 years. Permanent methods of contraception Female tubal ligation In this method, a woman's fallopian tubes are sealed, tied, or blocked during surgery to prevent eggs from traveling to the uterus. Hysteroscopic sterilization In this method, a small, flexible insert is placed into each fallopian tube. The inserts cause scar tissue to form in the fallopian tubes and block them, so sperm cannot reach an egg. The procedure takes about 3 months to be effective. Another form of birth control must be used during those 3 months. Female sterilization This is a procedure to tie off the tubes that carry sperm (vasectomy). After the procedure, the man can still ejaculate fluid (semen). Another form of birth control must be used for 3 months after the procedure. Natural planning methods Natural family planning In this method, a couple does not have sex on days when the woman could become pregnant. Calendar method In this method, the woman keeps track of the length of each menstrual cycle, identifies the days when pregnancy can happen, and does not have sex on those days. Ovulation method In this method, a couple avoids sex during ovulation. Symptothermal method This method involves not having sex during ovulation. The woman typically checks for ovulation by watching changes in her temperature and in the consistency of cervical mucus. Post-ovulation method In this method, a couple waits to have sex until after ovulation. Where to find more information  Centers for Disease Control and Prevention: www.cdc.gov Summary  Contraception, also called birth control, refers to methods or  devices that prevent pregnancy.  Hormonal methods of contraception include implants, injections, pills, patches, vaginal rings, and emergency contraceptives.  Barrier methods of contraception can include female condoms, female condoms, diaphragms, cervical caps, sponges, and spermicides.  There are two types of IUDs (intrauterine devices). An IUD can be put in a woman's uterus to prevent pregnancy for 3-5 years.  Permanent sterilization can be done through a procedure for males and females. Natural family planning methods involve nothaving sex on days when the woman could become pregnant. This information is not intended to replace advice given to you by your health care provider. Make sure you discuss any questions you have with your health care provider. Document Revised: 02/18/2020 Document Reviewed: 02/18/2020 Elsevier Patient Education  2021 Elsevier Inc.   Breastfeeding  Choosing to breastfeed is one of the best decisions you can make for yourself and your baby. A change in hormones during pregnancy causes your breasts to make breast milk in your milk-producing glands. Hormones prevent breast milk from being released before your baby is born. They also prompt milk flow after birth. Once breastfeeding has begun, thoughts of your baby, as well as his or her sucking or crying, can stimulate the release of milk from your milk-producing glands. Benefits of breastfeeding Research shows that breastfeeding offers many health benefits   for infants and mothers. It also offers a cost-free and convenient way to feed your baby. For your baby  Your first milk (colostrum) helps your baby's digestive system to function better.  Special cells in your milk (antibodies) help your baby to fight off infections.  Breastfed babies are less likely to develop asthma, allergies, obesity, or type 2 diabetes. They are also at lower risk for sudden infant death syndrome (SIDS).  Nutrients in breast milk are better  able to meet your baby's needs compared to infant formula.  Breast milk improves your baby's brain development. For you  Breastfeeding helps to create a very special bond between you and your baby.  Breastfeeding is convenient. Breast milk costs nothing and is always available at the correct temperature.  Breastfeeding helps to burn calories. It helps you to lose the weight that you gained during pregnancy.  Breastfeeding makes your uterus return faster to its size before pregnancy. It also slows bleeding (lochia) after you give birth.  Breastfeeding helps to lower your risk of developing type 2 diabetes, osteoporosis, rheumatoid arthritis, cardiovascular disease, and breast, ovarian, uterine, and endometrial cancer later in life. Breastfeeding basics Starting breastfeeding  Find a comfortable place to sit or lie down, with your neck and back well-supported.  Place a pillow or a rolled-up blanket under your baby to bring him or her to the level of your breast (if you are seated). Nursing pillows are specially designed to help support your arms and your baby while you breastfeed.  Make sure that your baby's tummy (abdomen) is facing your abdomen.  Gently massage your breast. With your fingertips, massage from the outer edges of your breast inward toward the nipple. This encourages milk flow. If your milk flows slowly, you may need to continue this action during the feeding.  Support your breast with 4 fingers underneath and your thumb above your nipple (make the letter "C" with your hand). Make sure your fingers are well away from your nipple and your baby's mouth.  Stroke your baby's lips gently with your finger or nipple.  When your baby's mouth is open wide enough, quickly bring your baby to your breast, placing your entire nipple and as much of the areola as possible into your baby's mouth. The areola is the colored area around your nipple. ? More areola should be visible above your  baby's upper lip than below the lower lip. ? Your baby's lips should be opened and extended outward (flanged) to ensure an adequate, comfortable latch. ? Your baby's tongue should be between his or her lower gum and your breast.  Make sure that your baby's mouth is correctly positioned around your nipple (latched). Your baby's lips should create a seal on your breast and be turned out (everted).  It is common for your baby to suck about 2-3 minutes in order to start the flow of breast milk. Latching Teaching your baby how to latch onto your breast properly is very important. An improper latch can cause nipple pain, decreased milk supply, and poor weight gain in your baby. Also, if your baby is not latched onto your nipple properly, he or she may swallow some air during feeding. This can make your baby fussy. Burping your baby when you switch breasts during the feeding can help to get rid of the air. However, teaching your baby to latch on properly is still the best way to prevent fussiness from swallowing air while breastfeeding. Signs that your baby has successfully latched onto   your nipple  Silent tugging or silent sucking, without causing you pain. Infant's lips should be extended outward (flanged).  Swallowing heard between every 3-4 sucks once your milk has started to flow (after your let-down milk reflex occurs).  Muscle movement above and in front of his or her ears while sucking. Signs that your baby has not successfully latched onto your nipple  Sucking sounds or smacking sounds from your baby while breastfeeding.  Nipple pain. If you think your baby has not latched on correctly, slip your finger into the corner of your baby's mouth to break the suction and place it between your baby's gums. Attempt to start breastfeeding again. Signs of successful breastfeeding Signs from your baby  Your baby will gradually decrease the number of sucks or will completely stop sucking.  Your baby  will fall asleep.  Your baby's body will relax.  Your baby will retain a small amount of milk in his or her mouth.  Your baby will let go of your breast by himself or herself. Signs from you  Breasts that have increased in firmness, weight, and size 1-3 hours after feeding.  Breasts that are softer immediately after breastfeeding.  Increased milk volume, as well as a change in milk consistency and color by the fifth day of breastfeeding.  Nipples that are not sore, cracked, or bleeding. Signs that your baby is getting enough milk  Wetting at least 1-2 diapers during the first 24 hours after birth.  Wetting at least 5-6 diapers every 24 hours for the first week after birth. The urine should be clear or pale yellow by the age of 5 days.  Wetting 6-8 diapers every 24 hours as your baby continues to grow and develop.  At least 3 stools in a 24-hour period by the age of 5 days. The stool should be soft and yellow.  At least 3 stools in a 24-hour period by the age of 7 days. The stool should be seedy and yellow.  No loss of weight greater than 10% of birth weight during the first 3 days of life.  Average weight gain of 4-7 oz (113-198 g) per week after the age of 4 days.  Consistent daily weight gain by the age of 5 days, without weight loss after the age of 2 weeks. After a feeding, your baby may spit up a small amount of milk. This is normal. Breastfeeding frequency and duration Frequent feeding will help you make more milk and can prevent sore nipples and extremely full breasts (breast engorgement). Breastfeed when you feel the need to reduce the fullness of your breasts or when your baby shows signs of hunger. This is called "breastfeeding on demand." Signs that your baby is hungry include:  Increased alertness, activity, or restlessness.  Movement of the head from side to side.  Opening of the mouth when the corner of the mouth or cheek is stroked (rooting).  Increased  sucking sounds, smacking lips, cooing, sighing, or squeaking.  Hand-to-mouth movements and sucking on fingers or hands.  Fussing or crying. Avoid introducing a pacifier to your baby in the first 4-6 weeks after your baby is born. After this time, you may choose to use a pacifier. Research has shown that pacifier use during the first year of a baby's life decreases the risk of sudden infant death syndrome (SIDS). Allow your baby to feed on each breast as long as he or she wants. When your baby unlatches or falls asleep while feeding from the   first breast, offer the second breast. Because newborns are often sleepy in the first few weeks of life, you may need to awaken your baby to get him or her to feed. Breastfeeding times will vary from baby to baby. However, the following rules can serve as a guide to help you make sure that your baby is properly fed:  Newborns (babies 4 weeks of age or younger) may breastfeed every 1-3 hours.  Newborns should not go without breastfeeding for longer than 3 hours during the day or 5 hours during the night.  You should breastfeed your baby a minimum of 8 times in a 24-hour period. Breast milk pumping Pumping and storing breast milk allows you to make sure that your baby is exclusively fed your breast milk, even at times when you are unable to breastfeed. This is especially important if you go back to work while you are still breastfeeding, or if you are not able to be present during feedings. Your lactation consultant can help you find a method of pumping that works best for you and give you guidelines about how long it is safe to store breast milk.      Caring for your breasts while you breastfeed Nipples can become dry, cracked, and sore while breastfeeding. The following recommendations can help keep your breasts moisturized and healthy:  Avoid using soap on your nipples.  Wear a supportive bra designed especially for nursing. Avoid wearing underwire-style  bras or extremely tight bras (sports bras).  Air-dry your nipples for 3-4 minutes after each feeding.  Use only cotton bra pads to absorb leaked breast milk. Leaking of breast milk between feedings is normal.  Use lanolin on your nipples after breastfeeding. Lanolin helps to maintain your skin's normal moisture barrier. Pure lanolin is not harmful (not toxic) to your baby. You may also hand express a few drops of breast milk and gently massage that milk into your nipples and allow the milk to air-dry. In the first few weeks after giving birth, some women experience breast engorgement. Engorgement can make your breasts feel heavy, warm, and tender to the touch. Engorgement peaks within 3-5 days after you give birth. The following recommendations can help to ease engorgement:  Completely empty your breasts while breastfeeding or pumping. You may want to start by applying warm, moist heat (in the shower or with warm, water-soaked hand towels) just before feeding or pumping. This increases circulation and helps the milk flow. If your baby does not completely empty your breasts while breastfeeding, pump any extra milk after he or she is finished.  Apply ice packs to your breasts immediately after breastfeeding or pumping, unless this is too uncomfortable for you. To do this: ? Put ice in a plastic bag. ? Place a towel between your skin and the bag. ? Leave the ice on for 20 minutes, 2-3 times a day.  Make sure that your baby is latched on and positioned properly while breastfeeding. If engorgement persists after 48 hours of following these recommendations, contact your health care provider or a lactation consultant. Overall health care recommendations while breastfeeding  Eat 3 healthy meals and 3 snacks every day. Well-nourished mothers who are breastfeeding need an additional 450-500 calories a day. You can meet this requirement by increasing the amount of a balanced diet that you eat.  Drink  enough water to keep your urine pale yellow or clear.  Rest often, relax, and continue to take your prenatal vitamins to prevent fatigue, stress, and low   vitamin and mineral levels in your body (nutrient deficiencies).  Do not use any products that contain nicotine or tobacco, such as cigarettes and e-cigarettes. Your baby may be harmed by chemicals from cigarettes that pass into breast milk and exposure to secondhand smoke. If you need help quitting, ask your health care provider.  Avoid alcohol.  Do not use illegal drugs or marijuana.  Talk with your health care provider before taking any medicines. These include over-the-counter and prescription medicines as well as vitamins and herbal supplements. Some medicines that may be harmful to your baby can pass through breast milk.  It is possible to become pregnant while breastfeeding. If birth control is desired, ask your health care provider about options that will be safe while breastfeeding your baby. Where to find more information: [employer]: www.llli.org Contact a health care provider if:  You feel like you want to stop breastfeeding or have become frustrated with breastfeeding.  Your nipples are cracked or bleeding.  Your breasts are red, tender, or warm.  You have: ? Painful breasts or nipples. ? A swollen area on either breast. ? A fever or chills. ? Nausea or vomiting. ? Drainage other than breast milk from your nipples.  Your breasts do not become full before feedings by the fifth day after you give birth.  You feel sad and depressed.  Your baby is: ? Too sleepy to eat well. ? Having trouble sleeping. ? More than 1 week old and wetting fewer than 6 diapers in a 24-hour period. ? Not gaining weight by 5 days of age.  Your baby has fewer than 3 stools in a 24-hour period.  Your baby's skin or the white parts of his or her eyes become yellow. Get help right away if:  Your baby is overly tired  (lethargic) and does not want to wake up and feed.  Your baby develops an unexplained fever. Summary  Breastfeeding offers many health benefits for infant and mothers.  Try to breastfeed your infant when he or she shows early signs of hunger.  Gently tickle or stroke your baby's lips with your finger or nipple to allow the baby to open his or her mouth. Bring the baby to your breast. Make sure that much of the areola is in your baby's mouth. Offer one side and burp the baby before you offer the other side.  Talk with your health care provider or lactation consultant if you have questions or you face problems as you breastfeed. This information is not intended to replace advice given to you by your health care provider. Make sure you discuss any questions you have with your health care provider. Document Revised: 12/08/2017 Document Reviewed: 10/15/2016 Elsevier Patient Education  2021 Elsevier Inc.  

## 2021-01-07 NOTE — Progress Notes (Signed)
Subjective:  Shawna Curtis is a 30 y.o. [phone] at 28w3dbeing seen today for ongoing prenatal care.  She is currently monitored for the following issues for this high-risk pregnancy and has Supervision of high risk pregnancy, antepartum; BMI 40.0-44.9, adult ([place]; History of cesarean delivery, currently pregnant; Rubella non-immune status, antepartum; Scotoma; Obesity in pregnancy; and LGA (large for gestational age) fetus affecting management of mother on their problem list.  Patient reports no complaints.  Contractions: Not present. Vag. Bleeding: None.  Movement: Present. Denies leaking of fluid.   The following portions of the patient's history were reviewed and updated as appropriate: allergies, current medications, past family history, past medical history, past social history, past surgical history and problem list. Problem list updated.  Objective:   Vitals:   01/07/21 1357  BP: 105/69  Pulse: (!) 106  Weight: 237 lb 9.6 oz (107.8 kg)    Fetal Status: Fetal Heart Rate (bpm): 160   Movement: Present     General:  Alert, oriented and cooperative. Patient is in no acute distress.  Skin: Skin is warm and dry. No rash noted.   Cardiovascular: Normal heart rate noted  Respiratory: Normal respiratory effort, no problems with respiration noted  Abdomen: Soft, gravid, appropriate for gestational age. Pain/Pressure: Present     Pelvic: Vag. Bleeding: None     Cervical exam deferred        Extremities: Normal range of motion.  Edema: Trace  Mental Status: Normal mood and affect. Normal behavior. Normal judgment and thought content.   Urinalysis:      Assessment and Plan:  Pregnancy: G4P1021 at 335w3d1. Supervision of high risk pregnancy, antepartum BP and FHR normal Long discussion had with patient regarding mode of delivery Most recent USKorearom 01/01/2021 shows EFW 4169g at 3639w4d99% Reviewed that at present she has several factors that make VBAC less likely (LGA  infant, maternal obesity, prior hx of CPD) Also reviewed TOLAC counseling of outcomes w successful VBAC vs RCS vs failed TOLAC within the context of this information After much discussion patient elects for RCS Message sent to schedule, orders placed  2. History of cesarean delivery, currently pregnant Plan for RCS as above  3. Rubella non-immune status, antepartum MMR PP  4. Visual field scotoma of left eye Not addressed at today's visit  5. Excessive fetal growth affecting management of pregnancy in third trimester, single or unspecified fetus See above  Term labor symptoms and general obstetric precautions including but not limited to vaginal bleeding, contractions, leaking of fluid and fetal movement were reviewed in detail with the patient. Please refer to After Visit Summary for other counseling recommendations.  Return in 1 week (on 01/14/2021) for ob visit.   EckClarnce FlockD

## 2021-01-08 ENCOUNTER — Encounter: Payer: Self-pay | Admitting: *Deleted

## 2021-01-09 ENCOUNTER — Encounter (HOSPITAL_COMMUNITY): Payer: Self-pay

## 2021-01-09 NOTE — Patient Instructions (Addendum)
Yuka Clydie Braun Wetherell  01/09/2021   Your procedure is scheduled on:  01/19/2021  Arrive at 1030 at Entrance C on [address] at [facility]  and [address]. You are invited to use the FREE valet parking or use the Visitor's parking deck.  Pick up the phone at the desk and dial [phone].  Call this number if you have problems the morning of surgery: [phone]  Remember:   Do not eat food:(After Midnight) Desps de medianoche.  Do not drink clear liquids: (After Midnight) Desps de medianoche.  Take these medicines the morning of surgery with A SIP OF WATER:  none   Do not wear jewelry, make-up or nail polish.  Do not wear lotions, powders, or perfumes. Do not wear deodorant.  Do not shave 48 hours prior to surgery.  Do not bring valuables to the hospital.  [facility] is not   responsible for any belongings or valuables brought to the hospital.  Contacts, dentures or bridgework may not be worn into surgery.  Leave suitcase in the car. After surgery it may be brought to your room.  For patients admitted to the hospital, checkout time is 11:00 AM the day of              discharge.      Please read over the following fact sheets that you were given:     Preparing for Surgery

## 2021-01-10 ENCOUNTER — Inpatient Hospital Stay (HOSPITAL_COMMUNITY)
Admission: AD | Admit: 2021-01-10 | Discharge: 2021-01-10 | Disposition: A | Discharge status: Home / Self Care | Payer: Managed Care, Other (non HMO) | Attending: Obstetrics & Gynecology | Admitting: Obstetrics & Gynecology

## 2021-01-10 ENCOUNTER — Encounter (HOSPITAL_COMMUNITY): Payer: Self-pay | Admitting: Obstetrics & Gynecology

## 2021-01-10 ENCOUNTER — Other Ambulatory Visit: Payer: Self-pay

## 2021-01-10 DIAGNOSIS — Z0371 Encounter for suspected problem with amniotic cavity and membrane ruled out: Secondary | ICD-10-CM | POA: Diagnosis not present

## 2021-01-10 DIAGNOSIS — Z3A37 37 weeks gestation of pregnancy: Secondary | ICD-10-CM | POA: Diagnosis not present

## 2021-01-10 LAB — AMNISURE RUPTURE OF MEMBRANE (ROM) NOT AT ARMC: Amnisure ROM: NEGATIVE

## 2021-01-10 NOTE — MAU Provider Note (Signed)
S: Ms. Shawna Curtis is a 30 y.o. [phone] at [redacted]w[redacted]d  who presents to MAU today complaining of leaking of fluid at 1600 while taking a shower. She reports the water felt different on her leg; "like a different texture." She is unsure if it is soap or amniotic. She denies any continuous leaking since then. She denies vaginal bleeding. She endorses contractions. She reports normal fetal movement.    O: BP 130/69 (BP Location: Right Arm)   Pulse (!) 101   Temp 98.1 F (36.7 C) (Oral)   Resp 18   LMP 04/20/2020   SpO2 98%  GENERAL: Well-developed, well-nourished female in no acute distress.  HEAD: Normocephalic, atraumatic.  CHEST: Normal effort of breathing, regular heart rate ABDOMEN: Soft, nontender, gravid PELVIC: Normal external female genitalia. Vagina is pink and rugated. Cervix with normal contour, no lesions. Normal discharge.  Pooling of thick, creamy white vaginal discharge; not c/w SROM.   Cervical exam:  Dilation: Fingertip Effacement (%): Thick Cervical Position: Posterior Station: Ballotable Presentation: Vertex Exam by:: Carloyn Jaeger CNM   Fetal Monitoring: Baseline: 160 Variability: moderate Accelerations: present Decelerations: absent Contractions: irregular contractions  Results for orders placed or performed during the hospital encounter of 01/10/21 (from the past 24 hour(s))  Amnisure rupture of membrane (rom)not at [facility]     Status: None   Collection Time: 01/10/21  7:46 PM  Result Value Ref Range   Amnisure ROM NEGATIVE     A: SIUP at [redacted]w[redacted]d  Membranes intact  P: No leakage of amniotic fluid into vagina  - Reassurance given that her water is not broken - Information provided on signs of labor   [redacted] weeks gestation of pregnancy  - Discharge patient - Keep scheduled appt with [facility] on Monday 01/12/2021 - Patient verbalized an understanding of the plan of care and agrees.     Raelyn Mora, CNM 01/10/2021, 7:24 PM

## 2021-01-10 NOTE — MAU Note (Signed)
Pt reports to mau with c/o poss. SROM around 1600.  Pt states she was taking a shower and noticed the water felt different on her leg.  Pt states she has not continued to leak since then.  Denies ctx, but reports pelvic pressure.  +FM

## 2021-01-12 ENCOUNTER — Other Ambulatory Visit: Payer: Self-pay

## 2021-01-12 ENCOUNTER — Ambulatory Visit (INDEPENDENT_AMBULATORY_CARE_PROVIDER_SITE_OTHER): Payer: Managed Care, Other (non HMO) | Admitting: Obstetrics and Gynecology

## 2021-01-12 ENCOUNTER — Encounter: Payer: Self-pay | Admitting: Obstetrics and Gynecology

## 2021-01-12 VITALS — BP 108/70 | HR 96 | Wt 241.8 lb

## 2021-01-12 DIAGNOSIS — O34219 Maternal care for unspecified type scar from previous cesarean delivery: Secondary | ICD-10-CM

## 2021-01-12 DIAGNOSIS — O09899 Supervision of other high risk pregnancies, unspecified trimester: Secondary | ICD-10-CM

## 2021-01-12 DIAGNOSIS — O0990 Supervision of high risk pregnancy, unspecified, unspecified trimester: Secondary | ICD-10-CM

## 2021-01-12 DIAGNOSIS — O0993 Supervision of high risk pregnancy, unspecified, third trimester: Secondary | ICD-10-CM

## 2021-01-12 DIAGNOSIS — Z2839 Other underimmunization status: Secondary | ICD-10-CM

## 2021-01-12 NOTE — Patient Instructions (Signed)
Cesarean Delivery, Care After This sheet gives you information about how to care for yourself after your procedure. Your health care provider may also give you more specific instructions. If you have problems or questions, contact your health care provider. What can I expect after the procedure? After the procedure, it is common to have:  A small amount of blood or clear fluid coming from the incision.  Some redness, swelling, and pain in your incision area.  Some abdominal pain and soreness.  Vaginal bleeding (lochia). Even though you did not have a vaginal delivery, you will still have vaginal bleeding and discharge.  Pelvic cramps.  Fatigue. You may have pain, swelling, and discomfort in the tissue between your vagina and your anus (perineum) if:  Your C-section was unplanned, and you were allowed to labor and push.  An incision was made in the area (episiotomy) or the tissue tore during attempted vaginal delivery. Follow these instructions at home: Incision care  Follow instructions from your health care provider about how to take care of your incision. Make sure you: ? Wash your hands with soap and water before you change your bandage (dressing). If soap and water are not available, use hand sanitizer. ? If you have a dressing, change it or remove it as told by your health care provider. ? Leave stitches (sutures), skin staples, skin glue, or adhesive strips in place. These skin closures may need to stay in place for 2 weeks or longer. If adhesive strip edges start to loosen and curl up, you may trim the loose edges. Do not remove adhesive strips completely unless your health care provider tells you to do that.  Check your incision area every day for signs of infection. Check for: ? More redness, swelling, or pain. ? More fluid or blood. ? Warmth. ? Pus or a bad smell.  Do not take baths, swim, or use a hot tub until your health care provider says it's okay. Ask your health  care provider if you can take showers.  When you cough or sneeze, hug a pillow. This helps with pain and decreases the chance of your incision opening up (dehiscing). Do this until your incision heals.   Medicines  Take over-the-counter and prescription medicines only as told by your health care provider.  If you were prescribed an antibiotic medicine, take it as told by your health care provider. Do not stop taking the antibiotic even if you start to feel better.  Do not drive or use heavy machinery while taking prescription pain medicine. Lifestyle  Do not drink alcohol. This is especially important if you are breastfeeding or taking pain medicine.  Do not use any products that contain nicotine or tobacco, such as cigarettes, e-cigarettes, and chewing tobacco. If you need help quitting, ask your health care provider. Eating and drinking  Drink at least 8 eight-ounce glasses of water every day unless told not to by your health care provider. If you breastfeed, you may need to drink even more water.  Eat high-fiber foods every day. These foods may help prevent or relieve constipation. High-fiber foods include: ? Whole grain cereals and breads. ? Brown rice. ? Beans. ? Fresh fruits and vegetables. Activity  If possible, have someone help you care for your baby and help with household activities for at least a few days after you leave the hospital.  Return to your normal activities as told by your health care provider. Ask your health care provider what activities are safe for   you.  Rest as much as possible. Try to rest or take a nap while your baby is sleeping.  Do not lift anything that is heavier than 10 lbs (4.5 kg), or the limit that you were told, until your health care provider says that it is safe.  Talk with your health care provider about when you can engage in sexual activity. This may depend on your: ? Risk of infection. ? How fast you heal. ? Comfort and desire to  engage in sexual activity.   General instructions  Do not use tampons or douches until your health care provider approves.  Wear loose, comfortable clothing and a supportive and well-fitting bra.  Keep your perineum clean and dry. Wipe from front to back when you use the toilet.  If you pass a blood clot, save it and call your health care provider to discuss. Do not flush blood clots down the toilet before you get instructions from your health care provider.  Keep all follow-up visits for you and your baby as told by your health care provider. This is important. Contact a health care provider if:  You have: ? A fever. ? Bad-smelling vaginal discharge. ? Pus or a bad smell coming from your incision. ? Difficulty or pain when urinating. ? A sudden increase or decrease in the frequency of your bowel movements. ? More redness, swelling, or pain around your incision. ? More fluid or blood coming from your incision. ? A rash. ? Nausea. ? Little or no interest in activities you used to enjoy. ? Questions about caring for yourself or your baby.  Your incision feels warm to the touch.  Your breasts turn red or become painful or hard.  You feel unusually sad or worried.  You vomit.  You pass a blood clot from your vagina.  You urinate more than usual.  You are dizzy or light-headed. Get help right away if:  You have: ? Pain that does not go away or get better with medicine. ? Chest pain. ? Difficulty breathing. ? Blurred vision or spots in your vision. ? Thoughts about hurting yourself or your baby. ? New pain in your abdomen or in one of your legs. ? A severe headache.  You faint.  You bleed from your vagina so much that you fill more than one sanitary pad in one hour. Bleeding should not be heavier than your heaviest period. Summary  After the procedure, it is common to have pain at your incision site, abdominal cramping, and slight bleeding from your vagina.  Check  your incision area every day for signs of infection.  Tell your health care provider about any unusual symptoms.  Keep all follow-up visits for you and your baby as told by your health care provider. This information is not intended to replace advice given to you by your health care provider. Make sure you discuss any questions you have with your health care provider. Document Revised: 03/22/2018 Document Reviewed: 03/22/2018 Elsevier Patient Education  2021 [employer].  

## 2021-01-12 NOTE — Progress Notes (Signed)
Subjective:  Shawna Curtis is a 30 y.o. [phone] at [redacted]w[redacted]d being seen today for ongoing prenatal care.  She is currently monitored for the following issues for this low-risk pregnancy and has Supervision of high risk pregnancy, antepartum; BMI 40.0-44.9, adult (HCC); History of cesarean delivery, currently pregnant; Rubella non-immune status, antepartum; Scotoma; Obesity in pregnancy; and LGA (large for gestational age) fetus affecting management of mother on their problem list.  Patient reports general discomforts of pregnancy.  Contractions: Irregular. Vag. Bleeding: None.  Movement: Present. Denies leaking of fluid.   The following portions of the patient's history were reviewed and updated as appropriate: allergies, current medications, past family history, past medical history, past social history, past surgical history and problem list. Problem list updated.  Objective:   Vitals:   01/12/21 1557  BP: 108/70  Pulse: 96  Weight: 241 lb 12.8 oz (109.7 kg)    Fetal Status: Fetal Heart Rate (bpm): 154   Movement: Present     General:  Alert, oriented and cooperative. Patient is in no acute distress.  Skin: Skin is warm and dry. No rash noted.   Cardiovascular: Normal heart rate noted  Respiratory: Normal respiratory effort, no problems with respiration noted  Abdomen: Soft, gravid, appropriate for gestational age. Pain/Pressure: Present     Pelvic:  Cervical exam deferred        Extremities: Normal range of motion.  Edema: None  Mental Status: Normal mood and affect. Normal behavior. Normal judgment and thought content.   Urinalysis:      Assessment and Plan:  Pregnancy: [id number] at [redacted]w[redacted]d Stable Labor precautions  Prior c section Repeat on 01/19/21  There are no diagnoses linked to this encounter. Term labor symptoms and general obstetric precautions including but not limited to vaginal bleeding, contractions, leaking of fluid and fetal movement were reviewed in detail  with the patient. Please refer to After Visit Summary for other counseling recommendations.  Return for 2 week PP incision check from 01/19/2021, 4 week PP visit from 01/19/21.   Hermina Staggers, MD

## 2021-01-12 NOTE — Progress Notes (Signed)
Complains of pressure on pelvic and constantly feeling tired

## 2021-01-14 ENCOUNTER — Encounter: Payer: Self-pay | Admitting: General Practice

## 2021-01-15 ENCOUNTER — Encounter: Payer: Managed Care, Other (non HMO) | Admitting: Physical Therapy

## 2021-01-15 ENCOUNTER — Ambulatory Visit: Payer: Self-pay | Admitting: *Deleted

## 2021-01-15 NOTE — Telephone Encounter (Signed)
Pt reports [redacted] weeks pregnant. States is scheduled for Csection 01/19/21. Reports Saturday noted 1 tablespoon "Fresh blood while on commode." States presently having brownish clots "Like spotting." Started Sunday. Having contractions, inconsistent  "Some are 30 minutes apart, others closer."  Advised per protocol to go to LD. Pt will call OB/GYN first.   Pt verbalizes understanding. Reason for Disposition . MILD-MODERATE vaginal bleeding (e.g., small to medium clots; like mild menstrual period) (Exception: Single episode of faint spotting when wiping, or slight spotting after intercourse or pelvic exam)  Answer Assessment - Initial Assessment Questions 1. ONSET: "When did this bleeding start?"        *SAturday 2. DESCRIPTION: "Describe the bleeding that you are having." "How much bleeding is there?"    - SPOTTING: spotting, or pinkish / brownish mucous discharge; does not fill panti-liner or pad    - MILD:  less than 1 pad / hour; less than patient\'s usual menstrual bleeding   - MODERATE: 1-2 pads / hour; 1 menstrual cup every 6 hours; small-medium blood clots (e.g., pea, grape, small coin)   - SEVERE: soaking 2 or more pads/hour for 2 or more hours; 1 menstrual cup every 2 hours; bleeding not contained by pads or continuous red blood from vagina; large blood clots (e.g., golf ball, large coin)      mild 3. ABDOMINAL PAIN SEVERITY: If present, ask: "How bad is it?"  (e.g., Scale 1-10; mild, moderate, or severe)   - MILD (1-3): doesn\'t interfere with normal activities, abdomen soft and not tender to touch    - MODERATE (4-7): interferes with normal activities or awakens from sleep, tender to touch    - SEVERE (8-10): excruciating pain, doubled over, unable to do any normal activities      4. PREGNANCY: "Do you know how many weeks or months pregnant you are?"      38  weeks 5. EDD: "What date are you expecting to deliver?"     01/19/21  C Section 6. FETAL MOVEMENT: "Has the baby's movement  decreased or changed significantly from normal?"      7. HEMODYNAMIC STATUS: "Are you weak or feeling lightheaded?" If Yes, ask: "Can you stand and walk normally?"      no 8. OTHER SYMPTOMS: "What other symptoms are you having with the bleeding?" (e.g., leaking fluid from vagina, contractions)     Contractions  Protocols used: PREGNANCY - VAGINAL BLEEDING GREATER THAN [redacted] WEEKS EGA-A-AH

## 2021-01-16 ENCOUNTER — Encounter (HOSPITAL_COMMUNITY)
Admission: RE | Admit: 2021-01-16 | Discharge: 2021-01-16 | Disposition: A | Discharge status: Home / Self Care | Payer: Managed Care, Other (non HMO) | Source: Ambulatory Visit | Attending: Family Medicine | Admitting: Family Medicine

## 2021-01-16 ENCOUNTER — Encounter (HOSPITAL_COMMUNITY): Payer: Self-pay | Admitting: Obstetrics and Gynecology

## 2021-01-16 ENCOUNTER — Other Ambulatory Visit: Payer: Self-pay

## 2021-01-16 ENCOUNTER — Encounter (HOSPITAL_COMMUNITY)
Admission: AD | Disposition: A | Discharge status: Home / Self Care | Payer: Self-pay | Source: Home / Self Care | Attending: Obstetrics and Gynecology

## 2021-01-16 ENCOUNTER — Inpatient Hospital Stay (HOSPITAL_COMMUNITY)
Admission: AD | Admit: 2021-01-16 | Discharge: 2021-01-19 | DRG: 788 | Disposition: A | Discharge status: Home / Self Care | Payer: Managed Care, Other (non HMO) | Attending: Obstetrics and Gynecology | Admitting: Obstetrics and Gynecology

## 2021-01-16 ENCOUNTER — Inpatient Hospital Stay (HOSPITAL_COMMUNITY): Payer: Managed Care, Other (non HMO) | Admitting: Anesthesiology

## 2021-01-16 ENCOUNTER — Other Ambulatory Visit (HOSPITAL_COMMUNITY): Payer: Managed Care, Other (non HMO)

## 2021-01-16 DIAGNOSIS — O3660X0 Maternal care for excessive fetal growth, unspecified trimester, not applicable or unspecified: Secondary | ICD-10-CM | POA: Diagnosis present

## 2021-01-16 DIAGNOSIS — O99210 Obesity complicating pregnancy, unspecified trimester: Secondary | ICD-10-CM | POA: Diagnosis present

## 2021-01-16 DIAGNOSIS — O34219 Maternal care for unspecified type scar from previous cesarean delivery: Secondary | ICD-10-CM | POA: Diagnosis present

## 2021-01-16 DIAGNOSIS — Z01812 Encounter for preprocedural laboratory examination: Secondary | ICD-10-CM | POA: Insufficient documentation

## 2021-01-16 DIAGNOSIS — O34211 Maternal care for low transverse scar from previous cesarean delivery: Secondary | ICD-10-CM | POA: Diagnosis present

## 2021-01-16 DIAGNOSIS — Z3A38 38 weeks gestation of pregnancy: Secondary | ICD-10-CM

## 2021-01-16 DIAGNOSIS — O09899 Supervision of other high risk pregnancies, unspecified trimester: Secondary | ICD-10-CM

## 2021-01-16 DIAGNOSIS — O3663X0 Maternal care for excessive fetal growth, third trimester, not applicable or unspecified: Secondary | ICD-10-CM | POA: Diagnosis present

## 2021-01-16 DIAGNOSIS — O4593 Premature separation of placenta, unspecified, third trimester: Principal | ICD-10-CM | POA: Diagnosis present

## 2021-01-16 DIAGNOSIS — Z87891 Personal history of nicotine dependence: Secondary | ICD-10-CM | POA: Diagnosis not present

## 2021-01-16 DIAGNOSIS — O99214 Obesity complicating childbirth: Secondary | ICD-10-CM | POA: Diagnosis present

## 2021-01-16 DIAGNOSIS — O0990 Supervision of high risk pregnancy, unspecified, unspecified trimester: Secondary | ICD-10-CM

## 2021-01-16 DIAGNOSIS — O458X3 Other premature separation of placenta, third trimester: Secondary | ICD-10-CM | POA: Diagnosis not present

## 2021-01-16 DIAGNOSIS — Z6841 Body Mass Index (BMI) 40.0 and over, adult: Secondary | ICD-10-CM

## 2021-01-16 DIAGNOSIS — Z20822 Contact with and (suspected) exposure to covid-19: Secondary | ICD-10-CM | POA: Diagnosis present

## 2021-01-16 DIAGNOSIS — Z2839 Other underimmunization status: Secondary | ICD-10-CM

## 2021-01-16 DIAGNOSIS — Z98891 History of uterine scar from previous surgery: Secondary | ICD-10-CM

## 2021-01-16 HISTORY — DX: Polycystic ovarian syndrome: E28.2

## 2021-01-16 HISTORY — DX: Headache, unspecified: R51.9

## 2021-01-16 HISTORY — DX: Other specified epidermal thickening: L85.8

## 2021-01-16 LAB — COMPREHENSIVE METABOLIC PANEL
ALT: 13 U/L (ref 0–44)
AST: 23 U/L (ref 15–41)
Albumin: 2.7 g/dL — ABNORMAL LOW (ref 3.5–5.0)
Alkaline Phosphatase: 146 U/L — ABNORMAL HIGH (ref 38–126)
Anion gap: 9 (ref 5–15)
BUN: 7 mg/dL (ref 6–20)
CO2: 22 mmol/L (ref 22–32)
Calcium: 9 mg/dL (ref 8.9–10.3)
Chloride: 106 mmol/L (ref 98–111)
Creatinine, Ser: 0.47 mg/dL (ref 0.44–1.00)
GFR, Estimated: >60 mL/min (ref >60)
Glucose, Bld: 87 mg/dL (ref 70–99)
Potassium: 4 mmol/L (ref 3.5–5.1)
Sodium: 137 mmol/L (ref 135–145)
Total Bilirubin: 0.3 mg/dL (ref 0.3–1.2)
Total Protein: 6.5 g/dL (ref 6.5–8.1)

## 2021-01-16 LAB — CBC
HCT: 38.3 % (ref 36.0–46.0)
Hemoglobin: 11.9 g/dL — ABNORMAL LOW (ref 12.0–15.0)
MCH: 26.6 pg (ref 26.0–34.0)
MCHC: 31.1 g/dL (ref 30.0–36.0)
MCV: 85.5 fL (ref 80.0–100.0)
Platelets: 290 10*3/uL (ref 150–400)
RBC: 4.48 MIL/uL (ref 3.87–5.11)
RDW: 16.4 % — ABNORMAL HIGH (ref 11.5–15.5)
WBC: 12.9 10*3/uL — ABNORMAL HIGH (ref 4.0–10.5)
nRBC: 0 % (ref 0.0–0.2)

## 2021-01-16 LAB — URINALYSIS, ROUTINE W REFLEX MICROSCOPIC
Bilirubin Urine: NEGATIVE
Glucose, UA: NEGATIVE mg/dL
Ketones, ur: NEGATIVE mg/dL
Nitrite: NEGATIVE
Protein, ur: NEGATIVE mg/dL
Specific Gravity, Urine: 1.015 (ref 1.005–1.030)
pH: 6 (ref 5.0–8.0)

## 2021-01-16 LAB — RESP PANEL BY RT-PCR (FLU A&B, COVID) ARPGX2
Influenza A by PCR: NEGATIVE
Influenza B by PCR: NEGATIVE
SARS Coronavirus 2 by RT PCR: NEGATIVE

## 2021-01-16 LAB — TYPE AND SCREEN
ABO/RH(D): O POS
Antibody Screen: NEGATIVE

## 2021-01-16 SURGERY — Surgical Case
Anesthesia: Regional

## 2021-01-16 SURGERY — Surgical Case
Anesthesia: Spinal | Wound class: Clean Contaminated

## 2021-01-16 MED ORDER — NALOXONE HCL 0.4 MG/ML IJ SOLN: 0.4000 mg | INTRAMUSCULAR | Status: DC | PRN

## 2021-01-16 MED ORDER — CEFAZOLIN SODIUM-DEXTROSE 2-4 GM/100ML-% IV SOLN: 2.0000 g | INTRAVENOUS | Status: DC

## 2021-01-16 MED ORDER — ACETAMINOPHEN 325 MG PO TABS
650.0000 mg | ORAL_TABLET | ORAL | Status: DC | PRN
  Administered 2021-01-17 – 2021-01-19 (×9): 650 mg via ORAL
  Filled 2021-01-16 (×9): qty 2

## 2021-01-16 MED ORDER — OXYCODONE HCL 5 MG PO TABS: 5.0000 mg | ORAL_TABLET | ORAL | Status: DC | PRN

## 2021-01-16 MED ORDER — DIPHENHYDRAMINE HCL 50 MG/ML IJ SOLN: 12.5000 mg | INTRAMUSCULAR | Status: DC | PRN

## 2021-01-16 MED ORDER — ONDANSETRON HCL 4 MG/2ML IJ SOLN
INTRAMUSCULAR | Status: AC
  Filled 2021-01-16: qty 2

## 2021-01-16 MED ORDER — OXYTOCIN-SODIUM CHLORIDE 30-0.9 UT/500ML-% IV SOLN
INTRAVENOUS | Status: DC | PRN
  Administered 2021-01-16: 300 mL via INTRAVENOUS

## 2021-01-16 MED ORDER — LACTATED RINGERS IV SOLN: INTRAVENOUS | Status: DC

## 2021-01-16 MED ORDER — STERILE WATER FOR IRRIGATION IR SOLN
Status: DC | PRN
  Administered 2021-01-16: 1000 mL

## 2021-01-16 MED ORDER — PRENATAL MULTIVITAMIN CH
1.0000 | ORAL_TABLET | Freq: Every day | ORAL | Status: DC
  Administered 2021-01-17 – 2021-01-19 (×3): 1 via ORAL
  Filled 2021-01-16 (×3): qty 1

## 2021-01-16 MED ORDER — KETOROLAC TROMETHAMINE 30 MG/ML IJ SOLN
30.0000 mg | Freq: Four times a day (QID) | INTRAMUSCULAR | Status: AC | PRN
  Administered 2021-01-16 – 2021-01-17 (×3): 30 mg via INTRAVENOUS
  Filled 2021-01-16 (×2): qty 1

## 2021-01-16 MED ORDER — FENTANYL CITRATE (PF) 100 MCG/2ML IJ SOLN
INTRAMUSCULAR | Status: AC
  Filled 2021-01-16: qty 2

## 2021-01-16 MED ORDER — ONDANSETRON HCL 4 MG/2ML IJ SOLN
INTRAMUSCULAR | Status: DC | PRN
  Administered 2021-01-16: 4 mg via INTRAVENOUS

## 2021-01-16 MED ORDER — CEFAZOLIN SODIUM-DEXTROSE 2-4 GM/100ML-% IV SOLN
2.0000 g | INTRAVENOUS | Status: AC
  Administered 2021-01-16: 2 g via INTRAVENOUS
  Filled 2021-01-16: qty 100

## 2021-01-16 MED ORDER — METOCLOPRAMIDE HCL 5 MG/ML IJ SOLN
INTRAMUSCULAR | Status: AC
  Filled 2021-01-16: qty 2

## 2021-01-16 MED ORDER — PHENYLEPHRINE HCL (PRESSORS) 10 MG/ML IV SOLN
INTRAVENOUS | Status: DC | PRN
  Administered 2021-01-16 (×4): 80 ug via INTRAVENOUS

## 2021-01-16 MED ORDER — SENNOSIDES-DOCUSATE SODIUM 8.6-50 MG PO TABS
2.0000 | ORAL_TABLET | ORAL | Status: DC
  Administered 2021-01-17 – 2021-01-19 (×3): 2 via ORAL
  Filled 2021-01-16 (×3): qty 2

## 2021-01-16 MED ORDER — ZOLPIDEM TARTRATE 5 MG PO TABS: 5.0000 mg | ORAL_TABLET | Freq: Every evening | ORAL | Status: DC | PRN

## 2021-01-16 MED ORDER — MEPERIDINE HCL 25 MG/ML IJ SOLN: 6.2500 mg | INTRAMUSCULAR | Status: DC | PRN

## 2021-01-16 MED ORDER — MORPHINE SULFATE (PF) 0.5 MG/ML IJ SOLN
INTRAMUSCULAR | Status: AC
  Filled 2021-01-16: qty 10

## 2021-01-16 MED ORDER — SOD CITRATE-CITRIC ACID 500-334 MG/5ML PO SOLN
30.0000 mL | ORAL | Status: AC
  Administered 2021-01-16: 30 mL via ORAL
  Filled 2021-01-16: qty 15

## 2021-01-16 MED ORDER — PHENYLEPHRINE 40 MCG/ML (10ML) SYRINGE FOR IV PUSH (FOR BLOOD PRESSURE SUPPORT)
PREFILLED_SYRINGE | INTRAVENOUS | Status: AC
  Filled 2021-01-16: qty 10

## 2021-01-16 MED ORDER — SCOPOLAMINE 1 MG/3DAYS TD PT72
1.0000 | MEDICATED_PATCH | TRANSDERMAL | Status: DC
  Administered 2021-01-16: 1.5 mg via TRANSDERMAL
  Filled 2021-01-16: qty 1

## 2021-01-16 MED ORDER — MORPHINE SULFATE (PF) 0.5 MG/ML IJ SOLN
INTRAMUSCULAR | Status: DC | PRN
  Administered 2021-01-16: .15 mg via INTRATHECAL

## 2021-01-16 MED ORDER — NALBUPHINE HCL 10 MG/ML IJ SOLN: 5.0000 mg | Freq: Once | INTRAMUSCULAR | Status: DC | PRN

## 2021-01-16 MED ORDER — OXYTOCIN-SODIUM CHLORIDE 30-0.9 UT/500ML-% IV SOLN: 2.5000 [IU]/h | INTRAVENOUS | Status: AC

## 2021-01-16 MED ORDER — ONDANSETRON HCL 4 MG/2ML IJ SOLN: 4.0000 mg | Freq: Three times a day (TID) | INTRAMUSCULAR | Status: DC | PRN

## 2021-01-16 MED ORDER — DIPHENHYDRAMINE HCL 25 MG PO CAPS
25.0000 mg | ORAL_CAPSULE | Freq: Four times a day (QID) | ORAL | Status: DC | PRN
Start: 2021-01-16 — End: 2021-01-19

## 2021-01-16 MED ORDER — FENTANYL CITRATE (PF) 100 MCG/2ML IJ SOLN
INTRAMUSCULAR | Status: DC | PRN
  Administered 2021-01-16: 15 ug via INTRATHECAL

## 2021-01-16 MED ORDER — OXYTOCIN-SODIUM CHLORIDE 30-0.9 UT/500ML-% IV SOLN
INTRAVENOUS | Status: AC
  Filled 2021-01-16: qty 500

## 2021-01-16 MED ORDER — POVIDONE-IODINE 10 % EX SWAB: 2.0000 "application " | Freq: Once | CUTANEOUS | Status: DC

## 2021-01-16 MED ORDER — SIMETHICONE 80 MG PO CHEW
80.0000 mg | CHEWABLE_TABLET | Freq: Three times a day (TID) | ORAL | Status: DC
  Administered 2021-01-17 – 2021-01-19 (×7): 80 mg via ORAL
  Filled 2021-01-16 (×8): qty 1

## 2021-01-16 MED ORDER — DEXAMETHASONE SODIUM PHOSPHATE 4 MG/ML IJ SOLN
INTRAMUSCULAR | Status: AC
  Filled 2021-01-16: qty 2

## 2021-01-16 MED ORDER — ENOXAPARIN SODIUM 60 MG/0.6ML ~~LOC~~ SOLN
50.0000 mg | SUBCUTANEOUS | Status: DC
  Administered 2021-01-17: 50 mg via SUBCUTANEOUS
  Filled 2021-01-16 (×4): qty 0.6

## 2021-01-16 MED ORDER — SODIUM CHLORIDE 0.9% FLUSH: 3.0000 mL | INTRAVENOUS | Status: DC | PRN

## 2021-01-16 MED ORDER — MENTHOL 3 MG MT LOZG: 1.0000 | LOZENGE | OROMUCOSAL | Status: DC | PRN

## 2021-01-16 MED ORDER — SODIUM CHLORIDE 0.9 % IR SOLN
Status: DC | PRN
  Administered 2021-01-16: 1

## 2021-01-16 MED ORDER — NALOXONE HCL 4 MG/10ML IJ SOLN
1.0000 ug/kg/h | INTRAVENOUS | Status: DC | PRN
  Filled 2021-01-16: qty 5

## 2021-01-16 MED ORDER — IBUPROFEN 600 MG PO TABS
600.0000 mg | ORAL_TABLET | Freq: Four times a day (QID) | ORAL | Status: DC | PRN
  Administered 2021-01-17 – 2021-01-19 (×8): 600 mg via ORAL
  Filled 2021-01-16 (×8): qty 1

## 2021-01-16 MED ORDER — FAMOTIDINE IN NACL 20-0.9 MG/50ML-% IV SOLN
20.0000 mg | Freq: Once | INTRAVENOUS | Status: AC
  Administered 2021-01-16: 20 mg via INTRAVENOUS
  Filled 2021-01-16: qty 50

## 2021-01-16 MED ORDER — POVIDONE-IODINE 10 % EX SWAB
2.0000 "application " | Freq: Once | CUTANEOUS | Status: AC
  Administered 2021-01-16: 2 via TOPICAL

## 2021-01-16 MED ORDER — TETANUS-DIPHTH-ACELL PERTUSSIS 5-2.5-18.5 LF-MCG/0.5 IM SUSY: 0.5000 mL | PREFILLED_SYRINGE | Freq: Once | INTRAMUSCULAR | Status: DC

## 2021-01-16 MED ORDER — NALBUPHINE HCL 10 MG/ML IJ SOLN: 5.0000 mg | INTRAMUSCULAR | Status: DC | PRN

## 2021-01-16 MED ORDER — PHENYLEPHRINE HCL-NACL 20-0.9 MG/250ML-% IV SOLN
INTRAVENOUS | Status: AC
  Filled 2021-01-16: qty 250

## 2021-01-16 MED ORDER — KETOROLAC TROMETHAMINE 30 MG/ML IJ SOLN
INTRAMUSCULAR | Status: AC
  Filled 2021-01-16: qty 1

## 2021-01-16 MED ORDER — PHENYLEPHRINE HCL (PRESSORS) 10 MG/ML IV SOLN: INTRAVENOUS | Status: DC | PRN

## 2021-01-16 MED ORDER — COCONUT OIL OIL: 1.0000 "application " | TOPICAL_OIL | Status: DC | PRN

## 2021-01-16 MED ORDER — KETOROLAC TROMETHAMINE 30 MG/ML IJ SOLN: 30.0000 mg | Freq: Four times a day (QID) | INTRAMUSCULAR | Status: AC | PRN

## 2021-01-16 MED ORDER — PHENYLEPHRINE HCL-NACL 20-0.9 MG/250ML-% IV SOLN
INTRAVENOUS | Status: DC | PRN
  Administered 2021-01-16: 60 ug/min via INTRAVENOUS

## 2021-01-16 MED ORDER — FENTANYL CITRATE (PF) 100 MCG/2ML IJ SOLN: 25.0000 ug | INTRAMUSCULAR | Status: DC | PRN

## 2021-01-16 MED ORDER — DIBUCAINE (PERIANAL) 1 % EX OINT: 1.0000 "application " | TOPICAL_OINTMENT | CUTANEOUS | Status: DC | PRN

## 2021-01-16 MED ORDER — DEXAMETHASONE SODIUM PHOSPHATE 4 MG/ML IJ SOLN
INTRAMUSCULAR | Status: DC | PRN
  Administered 2021-01-16: 4 mg via INTRAVENOUS

## 2021-01-16 MED ORDER — DIPHENHYDRAMINE HCL 25 MG PO CAPS: 25.0000 mg | ORAL_CAPSULE | ORAL | Status: DC | PRN

## 2021-01-16 MED ORDER — BUPIVACAINE IN DEXTROSE 0.75-8.25 % IT SOLN
INTRATHECAL | Status: DC | PRN
  Administered 2021-01-16: 1.5 mL via INTRATHECAL

## 2021-01-16 MED ORDER — SIMETHICONE 80 MG PO CHEW: 80.0000 mg | CHEWABLE_TABLET | ORAL | Status: DC | PRN

## 2021-01-16 MED ORDER — WITCH HAZEL-GLYCERIN EX PADS: 1.0000 "application " | MEDICATED_PAD | CUTANEOUS | Status: DC | PRN

## 2021-01-16 SURGICAL SUPPLY — 35 items
CANISTER PREVENA PLUS 150 (CANNISTER) ×2 IMPLANT
CHLORAPREP W/TINT 26ML (MISCELLANEOUS) ×2 IMPLANT
CLAMP CORD UMBIL (MISCELLANEOUS) IMPLANT
CLOTH BEACON ORANGE TIMEOUT ST (SAFETY) ×2 IMPLANT
DRESSING PREVENA PLUS CUSTOM (GAUZE/BANDAGES/DRESSINGS) ×1 IMPLANT
DRSG OPSITE POSTOP 4X10 (GAUZE/BANDAGES/DRESSINGS) ×2 IMPLANT
DRSG PREVENA PLUS CUSTOM (GAUZE/BANDAGES/DRESSINGS) ×2
ELECT REM PT RETURN 9FT ADLT (ELECTROSURGICAL) ×2
ELECTRODE REM PT RTRN 9FT ADLT (ELECTROSURGICAL) ×1 IMPLANT
EXTRACTOR VACUUM BELL STYLE (SUCTIONS) IMPLANT
GLOVE BIOGEL PI IND STRL 7.0 (GLOVE) ×2 IMPLANT
GLOVE BIOGEL PI INDICATOR 7.0 (GLOVE) ×2
GLOVE ECLIPSE 7.0 STRL STRAW (GLOVE) ×2 IMPLANT
GOWN STRL REUS W/TWL LRG LVL3 (GOWN DISPOSABLE) ×4 IMPLANT
KIT ABG SYR 3ML LUER SLIP (SYRINGE) ×2 IMPLANT
NEEDLE HYPO 25X5/8 SAFETYGLIDE (NEEDLE) ×2 IMPLANT
NS IRRIG 1000ML POUR BTL (IV SOLUTION) ×2 IMPLANT
PACK C SECTION WH (CUSTOM PROCEDURE TRAY) ×2 IMPLANT
PAD OB MATERNITY 4.3X12.25 (PERSONAL CARE ITEMS) ×2 IMPLANT
PENCIL SMOKE EVAC W/HOLSTER (ELECTROSURGICAL) ×2 IMPLANT
RTRCTR C-SECT PINK 25CM LRG (MISCELLANEOUS) ×2 IMPLANT
SUT MNCRL 0 VIOLET CTX 36 (SUTURE) ×2 IMPLANT
SUT MONOCRYL 0 CTX 36 (SUTURE) ×2
SUT PLAIN 0 NONE (SUTURE) IMPLANT
SUT PLAIN 2 0 (SUTURE)
SUT PLAIN 2 0 XLH (SUTURE) IMPLANT
SUT PLAIN ABS 2-0 CT1 27XMFL (SUTURE) IMPLANT
SUT VIC AB 0 CTX 36 (SUTURE) ×1
SUT VIC AB 0 CTX36XBRD ANBCTRL (SUTURE) ×1 IMPLANT
SUT VIC AB 2-0 CT1 27 (SUTURE) ×1
SUT VIC AB 2-0 CT1 TAPERPNT 27 (SUTURE) ×1 IMPLANT
SUT VIC AB 4-0 KS 27 (SUTURE) ×2 IMPLANT
TOWEL OR 17X24 6PK STRL BLUE (TOWEL DISPOSABLE) ×2 IMPLANT
TRAY FOLEY W/BAG SLVR 14FR LF (SET/KITS/TRAYS/PACK) IMPLANT
WATER STERILE IRR 1000ML POUR (IV SOLUTION) ×2 IMPLANT

## 2021-01-16 NOTE — Addendum Note (Signed)
Addendum  created 01/16/21 1955 by Armanda Heritage, CRNA   Clinical Note Signed, Intraprocedure Event edited

## 2021-01-16 NOTE — MAU Provider Note (Signed)
History     CSN: [id number]  Arrival date and time: 01/16/21 0946  Event Date/Time  First Provider Initiated Contact with Patient 01/16/21 1031      Chief Complaint  Patient presents with  . Vaginal Bleeding   HPI  Shawna Curtis is a 30 y.o. [id number] at 4w5dwho presents to MAU with chief complaint of vaginal bleeding. This is a recurrent problem, onset Saturday 01/10/2021. Patient has visualized multiple "teaspoonful" episodes of dark reddish-brown discharge. She has utilized an entire pack of pantyliners.   Patient endorses irregular mild contractions. She denies pain. She denies leaking of fluid, decreased fetal movement, fever, falls, or recent illness.  She states she has not had sexual intercourse since second trimester.  Patient is scheduled for repeat cesarean on 01/19/2021. She has been NPO since 2200 yesterday.   OB History    Gravida  4   Para  1   Term  1   Preterm      AB  2   Living  1     SAB  2   IAB      Ectopic      Multiple      Live Births  1           Past Medical History:  Diagnosis Date  . Dyspnea   . GERD (gastroesophageal reflux disease)   . Headache   . Keratosis pilaris   . PCOS (polycystic ovarian syndrome)     Past Surgical History:  Procedure Laterality Date  . CESAREAN SECTION    . OTHER SURGICAL HISTORY Left    cyst removed from left thumb    Family History  Problem Relation Age of Onset  . Asthma Mother   . Hypertension Father   . Diabetes Maternal Grandfather     Social History   Tobacco Use  . Smoking status: Former Smoker    Types: Cigarettes    Quit date: 2010    Years since quitting: 12.3  . Smokeless tobacco: Never Used  Vaping Use  . Vaping Use: Never used  Substance Use Topics  . Alcohol use: Not Currently    Comment: occasionally  . Drug use: Never    Allergies:  Allergies  Allergen Reactions  . Shellfish Allergy Hives    Medications Prior to Admission  Medication Sig  Dispense Refill Last Dose  . Blood Pressure Monitoring (BLOOD PRESSURE KIT) DEVI 1 Device by Does not apply route as needed. (Patient not taking: Reported on 01/12/2021) 1 each 0   . Prenatal Vit-Fe Fumarate-FA (PRENATAL VITAMINS PO) Take 1 capsule by mouth daily.       Review of Systems  Genitourinary: Positive for vaginal bleeding.  All other systems reviewed and are negative.  Physical Exam   Blood pressure 110/76, pulse 88, temperature 99 F (37.2 C), temperature source Oral, resp. rate 18, height '5\' 3"'  (1.6 m), weight 109.6 kg, last menstrual period 04/20/2020, SpO2 99 %, unknown if currently breastfeeding.  Physical Exam Vitals and nursing note reviewed. Exam conducted with a chaperone present.  Constitutional:      Appearance: Normal appearance. She is obese.  Cardiovascular:     Rate and Rhythm: Normal rate and regular rhythm.     Pulses: Normal pulses.     Heart sounds: Normal heart sounds.  Pulmonary:     Effort: Pulmonary effort is normal.     Breath sounds: Normal breath sounds.  Abdominal:     Tenderness: There is no abdominal tenderness.  Comments: Gravid  Genitourinary:    Comments: Pelvic exam: External genitalia normal, vaginal walls pink and well rugated, cervix visually closed, no lesions or lacerations noted. Small amount of dark red discharge noted at cervical os and pooling near cervix.  Skin:    Capillary Refill: Capillary refill takes less than 2 seconds.  Neurological:     Mental Status: She is alert and oriented to person, place, and time.  Psychiatric:        Mood and Affect: Mood normal.        Behavior: Behavior normal.        Thought Content: Thought content normal.        Judgment: Judgment normal.     MAU Course  Procedures  --Bleeding in patient with history of cesarean at [redacted]w[redacted]d --Reactive tracing: baseline 130, mod var, +accels, no decels --Toco: irregular contractions --Discussed with Dr. ERip Harbourand Dr. WSi Raider Additional workup not  indicated. Will prep for repeat cesarean. --Dr. WSi Raiderinbound to bedside for patient assessment and pre-op discussion  Assessment and Plan  --30y.o. [id number] 375w5d--Reactive tracing --FT/thick/ballotable --Vaginal bleeding --Per Dr. WoSi Raiderprep for ORHoustonCNM 01/16/2021, 11:40 AM

## 2021-01-16 NOTE — H&P (Signed)
LABOR AND DELIVERY ADMISSION HISTORY AND PHYSICAL NOTE  Frances Tressy Kunzman is a 30 y.o. female [phone] with IUP at 85w5dby early [place].  Reports small amount bright red blood beginning 6 days ago, now darker in color small clots when uses bathroom. Occasional contractions, otherwise no pelvic/abdominal pain. No leakage of fluid.  No recent intercourse. No discharge. .   She reports positive fetal movement.  Prenatal History/Complications:  Past Medical History: Past Medical History:  Diagnosis Date  . Dyspnea   . GERD (gastroesophageal reflux disease)   . Headache   . Keratosis pilaris   . PCOS (polycystic ovarian syndrome)     Past Surgical History: Past Surgical History:  Procedure Laterality Date  . CESAREAN SECTION    . OTHER SURGICAL HISTORY Left    cyst removed from left thumb    Obstetrical History: OB History    Gravida  4   Para  1   Term  1   Preterm      AB  2   Living  1     SAB  2   IAB      Ectopic      Multiple      Live Births  1           Social History: Social History   Socioeconomic History  . Marital status: Married    Spouse name: Not on file  . Number of children: Not on file  . Years of education: Not on file  . Highest education level: Not on file  Occupational History  . Not on file  Tobacco Use  . Smoking status: Former Smoker    Types: Cigarettes    Quit date: 2010    Years since quitting: 12.3  . Smokeless tobacco: Never Used  Vaping Use  . Vaping Use: Never used  Substance and Sexual Activity  . Alcohol use: Not Currently    Comment: occasionally  . Drug use: Never  . Sexual activity: Not Currently    Birth control/protection: None  Other Topics Concern  . Not on file  Social History Narrative  . Not on file   Social Determinants of Health   Financial Resource Strain: Not on file  Food Insecurity: Food Insecurity Present  . Worried About RShip brokerin the Last Year: Never true  . Ran Out of Food in the Last Year: Sometimes true  Transportation Needs: Unmet Transportation Needs  . Lack of Transportation (Medical): No  . Lack of Transportation (Non-Medical): Yes  Physical Activity: Not on file  Stress: Not on file  Social Connections: Not on file    Family History: Family History  Problem Relation Age of Onset  . Asthma Mother   . Hypertension Father   . Diabetes Maternal Grandfather     Allergies: Allergies  Allergen Reactions  . Shellfish Allergy Hives    Medications Prior to Admission  Medication Sig Dispense Refill Last Dose  . Blood Pressure Monitoring (BLOOD PRESSURE KIT) DEVI 1 Device by Does not apply route as needed. (Patient not taking: Reported on 01/12/2021) 1 each 0   . Prenatal Vit-Fe Fumarate-FA (PRENATAL VITAMINS PO) Take 1 capsule by mouth daily.        Review of Systems   All systems reviewed and negative except as stated in HPI  Blood pressure 110/76, pulse 88, temperature 99 F (37.2 C), temperature source Oral, resp. rate 18, height '5\' 3"'  (1.6 m),  weight 109.6 kg, last menstrual period 04/20/2020, SpO2 99 %, unknown if currently breastfeeding. General appearance: alert, cooperative and appears stated age Lungs: clear to auscultation bilaterally Heart: regular rate and rhythm Abdomen: soft, non-tender; bowel sounds normal Extremities: No calf swelling or tenderness Presentation: cephalic Fetal monitoring: 130/mod/+a/-d Uterine activity: quiet Dilation: Fingertip Effacement (%): Thick Station: Ballotable Exam by:: Rosine Abe CNM  Speculum exam per CNM showing dark blood small amount at os, no cervicitis, no laceration.  Prenatal labs: ABO, Rh: --/--/O POS (04/22 1287) Antibody: NEG (04/22 0931) Rubella: <0.90 (02/01 1205) RPR: Non Reactive (02/08 0916)  HBsAg: Negative (02/01 1205)  HIV: Non Reactive (02/08 0916)  GBS: Negative/-- (04/06 1457)  2 hr Glucola: wnl Genetic  screening:  Not performed Anatomy US: LGA  Prenatal Transfer Tool  Maternal Diabetes: No Genetic Screening: Declined Maternal Ultrasounds/Referrals: Normal and Other: lga Fetal Ultrasounds or other Referrals:  None Maternal Substance Abuse:  No Significant Maternal Medications:  None Significant Maternal Lab Results: Group B Strep negative  Results for orders placed or performed during the hospital encounter of 01/16/21 (from the past 24 hour(s))  CBC   Collection Time: 01/16/21  9:17 AM  Result Value Ref Range   WBC 12.9 (H) 4.0 - 10.5 K/uL   RBC 4.48 3.87 - 5.11 MIL/uL   Hemoglobin 11.9 (L) 12.0 - 15.0 g/dL   HCT 38.3 36.0 - 46.0 %   MCV 85.5 80.0 - 100.0 fL   MCH 26.6 26.0 - 34.0 pg   MCHC 31.1 30.0 - 36.0 g/dL   RDW 16.4 (H) 11.5 - 15.5 %   Platelets 290 150 - 400 K/uL   nRBC 0.0 0.0 - 0.2 %  Type and screen   Collection Time: 01/16/21  9:31 AM  Result Value Ref Range   ABO/RH(D) O POS    Antibody Screen NEG    Sample Expiration      01/19/2021,2359 Performed at [facility], [place] [address], [place], [place] [address]     Patient Active Problem List   Diagnosis Date Noted  . Obesity in pregnancy 12/04/2020  . LGA (large for gestational age) fetus affecting management of mother 12/04/2020  . Rubella non-immune status, antepartum 11/04/2020  . [place] 11/04/2020  . Supervision of high risk pregnancy, antepartum 10/28/2020  . BMI 40.0-44.9, adult ([place]) 10/28/2020  . History of cesarean delivery, currently pregnant 10/28/2020    Assessment: Josilyn Linet Brash is a 30 y.o. [id number] at 37w5dhere for vaginal bleeding.   # Vaginal bleeding: abruption suspected. Category 1 fetal heart tracing. Will proceed with rltcs today. We discussed TOLAC but after discussion of risks/benefits, and in light of LGA baby, patient desires RLTCS.  The risks of cesarean section were discussed with the patient including but were not limited to: bleeding which may require  transfusion or reoperation; infection which may require antibiotics; injury to bowel, bladder, ureters or other surrounding organs; injury to the fetus; need for additional procedures including hysterectomy in the event of a life-threatening hemorrhage; placental abnormalities wth subsequent pregnancies, incisional problems, thromboembolic phenomenon and other postoperative/anesthesia complications. he patient concurred with the proposed plan, giving informed written consent for the procedures.  Patient has been NPO since 10 pm yesterday, and she will remain NPO for procedure. Anesthesia and OR aware.  Preoperative prophylactic antibiotics and SCDs ordered on call to the OR.  To OR when ready.  #FWB: Category 1 #ID:  gbs neg #MOF: breast #MOC: undecided (counseled today including LARC discussion) #Circ:  Yes, inpatient  Patsy Lager [facility] 01/16/2021, 11:03 AM

## 2021-01-16 NOTE — Anesthesia Postprocedure Evaluation (Signed)
Anesthesia Post Note  Patient: Shawna Curtis  Procedure(s) Performed: CESAREAN SECTION (N/A )     Patient location during evaluation: PACU Anesthesia Type: Spinal Level of consciousness: oriented and awake and alert Pain management: pain level controlled Vital Signs Assessment: post-procedure vital signs reviewed and stable Respiratory status: spontaneous breathing, respiratory function stable and nonlabored ventilation Cardiovascular status: blood pressure returned to baseline and stable Postop Assessment: no headache, no backache, no apparent nausea or vomiting, spinal receding and patient able to bend at knees Anesthetic complications: no   No complications documented.  Last Vitals:  Vitals:   01/16/21 1700 01/16/21 1715  BP: 115/63 (!) 143/81  Pulse: 98 (!) 103  Resp: 20 17  Temp:  36.5 C  SpO2:  98%    Last Pain:  Vitals:   01/16/21 1715  TempSrc: Oral  PainSc:    Pain Goal:                Epidural/Spinal Function Cutaneous sensation: Tingles (01/16/21 1645), Patient able to flex knees: Yes (01/16/21 1645), Patient able to lift hips off bed: No (01/16/21 1645), Back pain beyond tenderness at insertion site: No (01/16/21 1645), Progressively worsening motor and/or sensory loss: No (01/16/21 1645), Bowel and/or bladder incontinence post epidural: No (01/16/21 1645)  Chera Slivka A.

## 2021-01-16 NOTE — Transfer of Care (Addendum)
Immediate Anesthesia Transfer of Care Note  Patient: Shawna Curtis  Procedure(s) Performed: CESAREAN SECTION (N/A )  Patient Location: PACU  Anesthesia Type:Spinal  Level of Consciousness: awake  Airway & Oxygen Therapy: Patient Spontanous Breathing  Post-op Assessment: Report given to RN  Post vital signs: Reviewed and stable  Last Vitals:  Vitals Value Taken Time  BP 104/60 01/16/21 1548  Temp    Pulse 87 01/16/21 1550  Resp 19 01/16/21 1550  SpO2 98 % 01/16/21 1550  Vitals shown include unvalidated device data.  Last Pain:  Vitals:   01/16/21 1410  TempSrc:   PainSc: 0-No pain         Complications: No complications documented.

## 2021-01-16 NOTE — Anesthesia Preprocedure Evaluation (Signed)
Anesthesia Evaluation  Patient identified by MRN, date of birth, ID band Patient awake    Reviewed: Allergy & Precautions, NPO status , Patient's Chart, lab work & pertinent test results  Airway Mallampati: II  TM Distance: >3 FB Neck ROM: Full    Dental no notable dental hx. (+) Teeth Intact   Pulmonary shortness of breath and with exertion, former smoker,    Pulmonary exam normal breath sounds clear to auscultation       Cardiovascular negative cardio ROS Normal cardiovascular exam Rhythm:Regular Rate:Normal     Neuro/Psych  Headaches, negative psych ROS   GI/Hepatic Neg liver ROS, GERD  Medicated and Controlled,  Endo/Other  Morbid obesityPCOS  Renal/GU negative Renal ROS  negative genitourinary   Musculoskeletal negative musculoskeletal ROS (+)   Abdominal (+) + obese,   Peds  Hematology   Anesthesia Other Findings   Reproductive/Obstetrics (+) Pregnancy Previous C/Section LGA                             Anesthesia Physical Anesthesia Plan  ASA: III  Anesthesia Plan: Spinal   Post-op Pain Management:    Induction:   PONV Risk Score and Plan: 4 or greater and Treatment may vary due to age or medical condition and Ondansetron  Airway Management Planned: Natural Airway  Additional Equipment:   Intra-op Plan:   Post-operative Plan: Extubation in OR  Informed Consent: I have reviewed the patients History and Physical, chart, labs and discussed the procedure including the risks, benefits and alternatives for the proposed anesthesia with the patient or authorized representative who has indicated his/her understanding and acceptance.     Dental advisory given  Plan Discussed with: CRNA and Anesthesiologist  Anesthesia Plan Comments:         Anesthesia Quick Evaluation

## 2021-01-16 NOTE — Lactation Note (Signed)
This note was copied from a baby's chart. Lactation Consultation Note Baby 8 hrs old. Baby had been BF a lot. Mom stated she needed some reminders of BF a newborn. Mom has a 30 yr old that she BF for 2 yrs. Mom was BF in cradle position when LC entered rm. Discussed body alignment and positions. Placed pillow under baby's head for support  And mom's arm.  Mom showed me her Rt. Nipple that has a blister. Comfort gels given. Newborn feeding habits, STS, I&O, support, safety, massage, supply and demand discussed. When baby came off nipple round.  Hand expression demonstrated. Colostrum noted. Mom encouraged to feed baby 8-12 times/24 hours and with feeding cues.  Answered mom's questions she had. Lactation brochure given.  Patient Name: Shawna Curtis Today's Date: 01/16/2021 Reason for consult: Initial assessment;Early term 37-38.6wks Age:8 hours  Maternal Data Has patient been taught Hand Expression?: Yes Does the patient have breastfeeding experience prior to this delivery?: Yes How long did the patient breastfeed?: 2 yrs  Feeding    LATCH Score Latch: Grasps breast easily, tongue down, lips flanged, rhythmical sucking.  Audible Swallowing: A few with stimulation  Type of Nipple: Everted at rest and after stimulation  Comfort (Breast/Nipple): Soft / non-tender  Hold (Positioning): Assistance needed to correctly position infant at breast and maintain latch.  LATCH Score: 8   Lactation Tools Discussed/Used    Interventions Interventions: Breast feeding basics reviewed;Support pillows;Assisted with latch;Position options;Skin to skin;Breast massage;Hand express;Breast compression;Adjust position  Discharge    Consult Status Consult Status: Follow-up Date: 01/17/21 Follow-up type: In-patient    Charyl Dancer 01/16/2021, 11:06 PM

## 2021-01-16 NOTE — Op Note (Signed)
Cesarean Section Operative Report  Shawna Curtis  01/16/2021   Indications: history prior cesarean section, suspected abruption  Pre-operative Diagnosis: RCS.   Post-operative Diagnosis: Same   Surgeon: Surgeon(s) and Role:    * Melisa Donofrio, Wilfred Curtis, MD - Primary  Assistants: none  Anesthesia: spinal    Estimated Blood Loss: 305 ml  Total IV Fluids: 1300 ml LR  Urine Output:: 200 ml clear yellow urine  Specimens: placenta to pathology  Findings: Viable female infant in cephalic presentation; Apgars pending; weight pending g; arterial cord pH not obtained;  clear amniotic fluid; intact placenta with three vessel cord; normal uterus, fallopian tubes and ovaries bilaterally.  Baby condition / location:  Couplet care / Skin to Skin   Complications: no complications. No significant adhesive disease.  Indications: Shawna Curtis is a 30 y.o. [phone] with an IUP [redacted]w[redacted]d presenting with vaginal bleeding, suspected abruption.  The risks, benefits, complications, treatment options, and exected outcomes were discussed with the patient . The patient dwith the proposed plan, giving informed consent. identified as Shawna Curtis and the procedure verified as C-Section Delivery.  Procedure Details:  The patient was taken back to the operative suite where spinal anesthesia was placed.  A time out was held and the above information confirmed.   After induction of anesthesia, the patient was draped and prepped in the usual sterile manner and placed in a dorsal supine position with a leftward tilt. A Pfannenstiel incision was made and carried down through the subcutaneous tissue to the fascia. Fascial incision was made and sharply extended transversely. The fascia was separated from the underlying rectus tissue superiorly and inferiorly. The peritoneum was identified and sharply entered and extended longitudinally. Alexis retractor was placed. A bladder flap was created.  A low transverse uterine incision was made and extended bluntly. Delivered from cephalic presentation with vacuum assistance was a viable infant with Apgars and weight as above.  After waiting 60 seconds for delayed cord cutting, the umbilical cord was clamped and cut cord blood was obtained for evaluation. Cord ph was not sent. The placenta was removed Intact and appeared normal.  The uterine incision was closed with running locked sutures of 0-Vicryl with an imbricating layer of the same.   Hemostasis was observed. The peritoneum was closed with 2-0 Vicryl. The rectus muscles were examined and hemostasis observed. The fascia was then reapproximated with running sutures of 0-Vicryl. The subcuticular closure was performed using 2-0 plain gut. The skin was closed with 4-0 Vicryl. Prevena wound vac placed.  Instrument, sponge, and needle counts were correct prior the abdominal closure and were correct at the conclusion of the case.     Disposition: PACU - hemodynamically stable.   Maternal Condition: stable       Signed: Lavonne Chick 01/16/2021 3:32 PM

## 2021-01-16 NOTE — MAU Note (Signed)
Husband arrived, plan explained.  Pt denies pain, aware of tightness in abd, is not painful.  Up to bathroom, scant lt brown seen, no change.

## 2021-01-16 NOTE — MAU Note (Signed)
Was here for pre- op appt. Mentioned bleeding, sent down for further eval.  First noted bleeding, ' about a tablespoon of bright red in underwear' on Saturday.  Noted a few times when wiped, Sunday was min and darker red, saw a few tiny clots when used restroom.  Unsure if contracting, sometimes has pain, tightness in lower abd.

## 2021-01-16 NOTE — Anesthesia Procedure Notes (Signed)
Spinal  Patient location during procedure: OR Start time: 01/16/2021 2:19 PM End time: 01/16/2021 2:23 PM Reason for block: surgical anesthesia Staffing Performed: anesthesiologist  Anesthesiologist: Mal Amabile, MD Preanesthetic Checklist Completed: patient identified, IV checked, site marked, risks and benefits discussed, surgical consent, monitors and equipment checked, pre-op evaluation and timeout performed Spinal Block Patient position: sitting Prep: DuraPrep and site prepped and draped Patient monitoring: heart rate, cardiac monitor, continuous pulse ox and blood pressure Approach: midline Location: L3-4 Injection technique: single-shot Needle Needle type: Pencan  Needle gauge: 24 G Needle length: 9 cm Needle insertion depth: 7 cm Assessment Sensory level: T4 Events: CSF return Additional Notes Patient tolerated procedure well. Adequate sensory level.

## 2021-01-16 NOTE — MAU Note (Signed)
Never went into labor or dilated with first preg

## 2021-01-16 NOTE — Pre-Procedure Instructions (Signed)
C/o spotting and small blood clots when voiding.  Sent to MAU for evaluation and care.

## 2021-01-17 ENCOUNTER — Encounter (HOSPITAL_COMMUNITY): Payer: Self-pay | Admitting: Obstetrics and Gynecology

## 2021-01-17 LAB — CBC
HCT: 31.8 % — ABNORMAL LOW (ref 36.0–46.0)
Hemoglobin: 10 g/dL — ABNORMAL LOW (ref 12.0–15.0)
MCH: 26.7 pg (ref 26.0–34.0)
MCHC: 31.4 g/dL (ref 30.0–36.0)
MCV: 85 fL (ref 80.0–100.0)
Platelets: 285 10*3/uL (ref 150–400)
RBC: 3.74 MIL/uL — ABNORMAL LOW (ref 3.87–5.11)
RDW: 16.3 % — ABNORMAL HIGH (ref 11.5–15.5)
WBC: 16.3 10*3/uL — ABNORMAL HIGH (ref 4.0–10.5)
nRBC: 0 % (ref 0.0–0.2)

## 2021-01-17 MED ORDER — MEASLES, MUMPS & RUBELLA VAC IJ SOLR: 0.5000 mL | Freq: Once | INTRAMUSCULAR | Status: DC

## 2021-01-17 NOTE — Lactation Note (Signed)
This note was copied from a baby's chart. Lactation Consultation Note Mom side BF but mom isn't laying on her side.  Baby swaddled w/blanket, wearing t-shirt and hat. Removed hat and un-swaddled. Turned baby more on his side, elevated baby up to mom's breast to position baby closer. Mom was holding her breast bending it backwards to BF. Repositioned mom stated comfortable. Mom stated she can't lay on her side d/t pain.  Mom stated she is post-pumping and supplementing w/formula because she doesn't have anything right now in her breast. Mom can't express colostrum. LC expressed a dot of colostrum from breast baby is BF on. Unable to express from Lt. Breast. Explained to mom she was also laying down I was having to work against gravity to hand express. Mom stated she couldn't get anything when she was sitting up on couch.  Praised mom for all of her hard work. Encouraged mom to relax. Mom had over supply w/her first child.   Patient Name: Shawna Curtis Today's Date: 01/17/2021 Reason for consult: Follow-up assessment;Early term 37-38.6wks Age:60 hours  Maternal Data    Feeding Mother's Current Feeding Choice: Breast Milk and Formula  LATCH Score Latch: Grasps breast easily, tongue down, lips flanged, rhythmical sucking.  Audible Swallowing: A few with stimulation  Type of Nipple: Everted at rest and after stimulation  Comfort (Breast/Nipple): Filling, red/small blisters or bruises, mild/mod discomfort (a little red in center)  Hold (Positioning): Assistance needed to correctly position infant at breast and maintain latch.  LATCH Score: 7   Lactation Tools Discussed/Used Tools: Pump Breast pump type: Double-Electric Breast Pump Reason for Pumping: stimulation  Interventions Interventions: Breast feeding basics reviewed;Support pillows;Position options;Adjust position;Breast compression  Discharge    Consult Status Consult Status: Follow-up Date: 01/18/21 Follow-up  type: In-patient    Charyl Dancer 01/17/2021, 11:23 PM

## 2021-01-17 NOTE — Progress Notes (Signed)
Subjective: POD#1 rLTCS Patient is doing well without complaints. Ambulating without difficulty. Passing flatus. Tolerating PO. Abdominal pain improved. Vaginal bleeding decreased. Has not spontaneously voided since catheter removal.  Objective: Vital signs in last 24 hours: Temp:  [97.7 F (36.5 C)-99 F (37.2 C)] 98.4 F (36.9 C) (04/23 0556) Pulse Rate:  [70-103] 79 (04/23 0556) Resp:  [13-26] 18 (04/23 0556) BP: (102-143)/(55-95) 116/62 (04/23 0556) SpO2:  [97 %-100 %] 99 % (04/23 0556) Weight:  [109.6 kg] 109.6 kg (04/22 1004)  Physical Exam:  General: alert, cooperative and no distress Lochia: appropriate Uterine Fundus: firm Incision: prevena wound vac in place DVT Evaluation: No evidence of DVT seen on physical exam.  Recent Labs    01/16/21 0917 01/17/21 0424  HGB 11.9* 10.0*  HCT 38.3 31.8*    Assessment/Plan: POD#1 rLTCS  -doing well, meeting pp milestones, VSS  -breastfeeding, doing well  -desires circ for baby, consented  -counseled on contraception, desires NFP  #Rubella NI: MMR postpartum  Plan to discharge POD#2 or POD#3.  Arrie Senate 0/99/2780, 7:27 AM

## 2021-01-18 LAB — RPR: RPR Ser Ql: NONREACTIVE

## 2021-01-18 NOTE — Progress Notes (Signed)
Subjective: Postpartum Day 2: Cesarean Delivery Patient reports incisional pain, tolerating PO, + flatus, + BM and no problems voiding.    Objective: Vital signs in last 24 hours: Temp:  [97.7 F (36.5 C)-98.6 F (37 C)] 97.7 F (36.5 C) (04/24 0639) Pulse Rate:  [75-94] 80 (04/24 0639) Resp:  [16-18] 16 (04/24 0639) BP: (110-115)/(68-80) 115/80 (04/24 0639) SpO2:  [100 %] 100 % (04/23 1515)  Physical Exam:  General: alert, cooperative and no distress Lochia: appropriate Uterine Fundus: firm Incision: healing well, no significant drainage, no dehiscence DVT Evaluation: No evidence of DVT seen on physical exam.  Recent Labs    01/16/21 0917 01/17/21 0424  HGB 11.9* 10.0*  HCT 38.3 31.8*    Assessment/Plan: Status post Cesarean section. Doing well postoperatively.  Continue current care, plan for discharge tomorrow. Pain expectations and management reviewed at length and abdominal binder ordered. Offered early discharge and patient declined.  Rolm Bookbinder CNM 01/18/2021, 10:21 AM

## 2021-01-19 ENCOUNTER — Inpatient Hospital Stay (HOSPITAL_COMMUNITY)
Admission: RE | Admit: 2021-01-19 | Payer: Managed Care, Other (non HMO) | Source: Home / Self Care | Admitting: Family Medicine

## 2021-01-19 DIAGNOSIS — Z6841 Body Mass Index (BMI) 40.0 and over, adult: Secondary | ICD-10-CM

## 2021-01-19 DIAGNOSIS — Z2839 Other underimmunization status: Secondary | ICD-10-CM

## 2021-01-19 DIAGNOSIS — O3663X0 Maternal care for excessive fetal growth, third trimester, not applicable or unspecified: Secondary | ICD-10-CM

## 2021-01-19 MED ORDER — IBUPROFEN 600 MG PO TABS: 600.0000 mg | ORAL_TABLET | Freq: Four times a day (QID) | ORAL | 0 refills | Status: DC | PRN

## 2021-01-19 MED ORDER — ACETAMINOPHEN 325 MG PO TABS: 650.0000 mg | ORAL_TABLET | ORAL | 0 refills | Status: DC | PRN

## 2021-01-19 NOTE — Lactation Note (Signed)
This note was copied from a baby's chart. Lactation Consultation Note Attempted to see mom. Everyone sleeping.  Patient Name: Shawna Curtis Today's Date: 01/19/2021   Age:30 hours  Maternal Data    Feeding    LATCH Score                    Lactation Tools Discussed/Used    Interventions    Discharge    Consult Status      Charyl Dancer 01/19/2021, 3:05 AM

## 2021-01-19 NOTE — Discharge Summary (Signed)
Postpartum Discharge Summary  Date of Service updated 01/19/21     Patient Name: Shawna Curtis DOB: January 15, 1991 MRN: [id number]  Date of admission: 01/16/2021 Delivery date:01/16/2021  Delivering provider: Laurey Arrow [place]  Date of discharge: 01/19/2021  Admitting diagnosis: History of cesarean section [Z98.891] Intrauterine pregnancy: [redacted]w[redacted]d    Secondary diagnosis:  Active Problems:   Supervision of high risk pregnancy, antepartum   BMI 40.0-44.9, adult ([place]   History of cesarean delivery, currently pregnant   Rubella non-immune status, antepartum   Obesity in pregnancy   LGA (large for gestational age) fetus affecting management of mother   History of cesarean section  Additional problems: abruption    Discharge diagnosis: Term Pregnancy Delivered and abruption                                              Post partum procedures:None Augmentation: N/A Complications: Placental Abruption  Hospital course: Onset of Labor With Unplanned C/S   30y.o. yo [id number] 366w5das admitted  01/16/2021. Patient had a labor course significant for suspected abruption and vaginal  bleeding. The patient went for cesarean section due to vaginal bleeding . Delivery details as follows: Membrane Rupture Time/Date: 2:48 PM ,01/16/2021   Delivery Method:C-Section, Vacuum Assisted  Details of operation can be found in separate operative note. Patient had an uncomplicated postpartum course.  She is ambulating,tolerating a regular diet, passing flatus, and urinating well.  Patient is discharged home in stable condition 01/19/21.  Newborn Data: Birth date:01/16/2021  Birth time:2:50 PM  Gender:Female  Living status:Living  Apgars:8 ,9  Weight:4182 g   Magnesium Sulfate received: No BMZ received: No Rhophylac:No MMR:Yes - ordered  T-DaP:Given prenatally Flu: No Transfusion:No  Physical exam  Vitals:   01/18/21 0639 01/18/21 1448 01/18/21 1915 01/19/21 0516  BP: 115/80 111/66  121/79 116/82  Pulse: 80 75 93 78  Resp: '16 16 18 16  ' Temp: 97.7 F (36.5 C) 98.4 F (36.9 C) 98.2 F (36.8 C) 97.7 F (36.5 C)  TempSrc:   Oral Oral  SpO2:      Weight:      Height:       General: alert Lochia: appropriate Uterine Fundus: firm Incision: Dressing is clean, dry, and intact- Prevena in place DVT Evaluation: No evidence of DVT seen on physical exam. Labs: Lab Results  Component Value Date   WBC 16.3 (H) 01/17/2021   HGB 10.0 (L) 01/17/2021   HCT 31.8 (L) 01/17/2021   MCV 85.0 01/17/2021   PLT 285 01/17/2021   CMP Latest Ref Rng & Units 01/16/2021  Glucose 70 - 99 mg/dL 87  BUN 6 - 20 mg/dL 7  Creatinine 0.44 - 1.00 mg/dL 0.47  Sodium 135 - 145 mmol/L 137  Potassium 3.5 - 5.1 mmol/L 4.0  Chloride 98 - 111 mmol/L 106  CO2 22 - 32 mmol/L 22  Calcium 8.9 - 10.3 mg/dL 9.0  Total Protein 6.5 - 8.1 g/dL 6.5  Total Bilirubin 0.3 - 1.2 mg/dL 0.3  Alkaline Phos 38 - 126 U/L 146(H)  AST 15 - 41 U/L 23  ALT 0 - 44 U/L 13   Edinburgh Score: Edinburgh Postnatal Depression Scale Screening Tool 01/19/2021  I have been able to laugh and see the funny side of things. 0  I have looked forward with enjoyment to things. 0  I  have blamed myself unnecessarily when things went wrong. 2  I have been anxious or worried for no good reason. 0  I have felt scared or panicky for no good reason. 0  Things have been getting on top of me. 1  I have been so unhappy that I have had difficulty sleeping. 1  I have felt sad or miserable. 0  I have been so unhappy that I have been crying. 0  The thought of harming myself has occurred to me. 0  Edinburgh Postnatal Depression Scale Total 4     After visit meds:  Allergies as of 01/19/2021      Reactions   Shellfish Allergy Hives      Medication List    STOP taking these medications   Blood Pressure Kit Devi     TAKE these medications   acetaminophen 325 MG tablet Commonly known as: TYLENOL Take 2 tablets (650 mg total) by  mouth every 4 (four) hours as needed for mild pain (temperature > 101.5.).   ibuprofen 600 MG tablet Commonly known as: ADVIL Take 1 tablet (600 mg total) by mouth every 6 (six) hours as needed for fever or headache.   PRENATAL VITAMINS PO Take 1 capsule by mouth daily.        Discharge home in stable condition Infant Feeding: Bottle and Breast Infant Disposition:home with mother Discharge instruction: per After Visit Summary and Postpartum booklet. Activity: Advance as tolerated. Pelvic rest for 6 weeks.  Diet: routine diet Future Appointments: Future Appointments  Date Time Provider [place]  02/02/2021  3:00 PM [facility] NURSE [facility]  02/16/2021  2:55 PM Caren Macadam, MD [facility]   Follow up Visit:   Please schedule this patient for a In person postpartum visit in 1 week with the following provider: MD. Additional Postpartum F/U:Incision check 1 week - prevena in place.  High risk pregnancy complicated by: GDM Delivery mode:  C-Section, Vacuum Assisted  Anticipated Birth Control:  NFP  MMR prior to DC to be given by RN   01/19/2021 Noni Saupe, NP

## 2021-01-19 NOTE — Lactation Note (Signed)
This note was copied from a baby's chart. Lactation Consultation Note  Patient Name: Boy Celenia Dearmond Today's Date: 01/19/2021 Reason for consult: Follow-up assessment;Early term 63-38.6wks Age:30 hours   P2 mother whose infant is now 15 hours old.  This is an ETI at 38+5 weeks.  Mother breast fed her first child (now 53 years old) for 2 years.  Mother is breast feeding and supplementing with her EBM and formula.  Mother had no questions/concerns related to breast feeding.  She stated her breasts were full today.  Upon assessment, I noticed full and heavy breasts; not engorged.  Discussed engorgement prevention/treatment.  Mother is feeding back her EBM prior to giving any formula supplementation.  Mother is hearing swallowing and having uterine contractions during feedings.  Baby is taking large volumes of formula supplementation without difficulty.    She will continue to feed 8-12 times/24 hours or sooner if baby shows cues.  She will pump as needed.  Informed mother that if baby is breast feeding well she does not have to pump after feedings.  Mother has our OP phone number for any further questions/concerns.  Support person present and asleep on the couch.   Maternal Data Has patient been taught Hand Expression?: Yes Does the patient have breastfeeding experience prior to this delivery?: Yes How long did the patient breastfeed?: 2 years  Feeding Mother's Current Feeding Choice: Breast Milk and Formula  LATCH Score                    Lactation Tools Discussed/Used Breast pump type: Double-Electric Breast Pump;Manual Pumping frequency: After breast feeding Pumped volume: 8 mL  Interventions Interventions: Education  Discharge Discharge Education: Engorgement and breast care Pump: DEBP;Manual;Personal  Consult Status Consult Status: Complete Date: 01/19/21 Follow-up type: Call as needed    Lakitha Gordy R [place] 01/19/2021, 8:04 AM

## 2021-01-19 NOTE — Lactation Note (Signed)
This note was copied from a baby's chart. Lactation Consultation Note Mom stated that she isn't going home until Tuesday. Mom states that BF going well, pumping going well then mom is supplementing after BF w/formula. Mom was sitting on the side of the bed pumping when LC entered rm. Mom collecting some colostrum. Mom stated her breast are filling.  Encouraged mom to give her BM before giving formula and shouldn't need to supplement when milk comes in. Encouraged mom to call for assistance or questions. Mom stated she has none at this time.  Patient Name: Shawna Curtis Today's Date: 01/19/2021 Reason for consult: Follow-up assessment;Early term 37-38.6wks Age:66 hours  Maternal Data    Feeding Mother's Current Feeding Choice: Breast Milk and Formula  LATCH Score       Type of Nipple: Everted at rest and after stimulation  Comfort (Breast/Nipple): Filling, red/small blisters or bruises, mild/mod discomfort         Lactation Tools Discussed/Used    Interventions Interventions: DEBP;Breast compression  Discharge    Consult Status Consult Status: Follow-up Date: 01/20/21 Follow-up type: In-patient    Charyl Dancer 01/19/2021, 3:31 AM

## 2021-01-20 LAB — SURGICAL PATHOLOGY

## 2021-01-21 ENCOUNTER — Encounter: Payer: Managed Care, Other (non HMO) | Admitting: Physical Therapy

## 2021-01-23 ENCOUNTER — Ambulatory Visit: Payer: Managed Care, Other (non HMO) | Admitting: Obstetrics and Gynecology

## 2021-01-26 ENCOUNTER — Ambulatory Visit: Payer: Self-pay | Admitting: *Deleted

## 2021-01-26 ENCOUNTER — Ambulatory Visit (INDEPENDENT_AMBULATORY_CARE_PROVIDER_SITE_OTHER): Payer: Managed Care, Other (non HMO)

## 2021-01-26 VITALS — BP 118/73 | HR 87 | Wt 220.5 lb

## 2021-01-26 DIAGNOSIS — Z5189 Encounter for other specified aftercare: Secondary | ICD-10-CM

## 2021-01-26 DIAGNOSIS — R238 Other skin changes: Secondary | ICD-10-CM

## 2021-01-26 MED ORDER — BENADRYL ITCH STOPPING 1-0.1 % EX CREA: TOPICAL_CREAM | Freq: Three times a day (TID) | CUTANEOUS | 0 refills | Status: DC | PRN

## 2021-01-26 NOTE — Patient Instructions (Addendum)
Oatmeal soap for itching Baby oil for adhesive removal Benadryl cream prescription

## 2021-01-26 NOTE — Telephone Encounter (Signed)
Her vac was removed today from her c-section on 01/16/21. Pt wants to know if a pulling sensation is normal. Please advise Called patient to review pulling sensation form d/c of wound vac. Patient reports she feels a pulling sensation at times since discontinuation of wound vac. Reports non adherant gauze applied over wound and no drainage noted or redness. Reviewed with patient sensation maybe there due to no longer having wound vac on and she may feel stimulation of new growth of skin healing. Instructed patient if pain occurs, drainage of yellow or green or red noted contact PCP or specialist. encouraged patient to contact MD tomorrow to review dressing changes and f/u on how she is feeling. Patient verbalized understanding and will call back or call wound care RN in am for further questions. Patient verbalized understanding to go to ED if worsening symptoms.Marland Kitchen

## 2021-01-26 NOTE — Progress Notes (Signed)
Here today following repeat c-section on 01/16/21 for Provena removal and wound check. Provena removed. Incision appears clean and dry with 2 small open, healing areas. No redness, warmth, or drainage to wound or surrounding area. Pt reports itching to surrounding area. Pt also experiencing generalized itching, especially at night. Reviewed with Alysia Penna, MD who recommends baby oil to remove remaining adhesives, Benadryl cream to area surrounding incision for itching, and oatmeal soap for generalized itching. Good wound care and s/s of infection reviewed with pt and support person.   Pt denies any other concerns today. Will return in 1 week for wound check.   Fleet Contras RN 01/26/21

## 2021-01-27 NOTE — Progress Notes (Signed)
Agree with A & P. 

## 2021-01-28 ENCOUNTER — Encounter: Payer: Managed Care, Other (non HMO) | Admitting: Physical Therapy

## 2021-02-02 ENCOUNTER — Encounter: Payer: Self-pay | Admitting: *Deleted

## 2021-02-02 ENCOUNTER — Ambulatory Visit (INDEPENDENT_AMBULATORY_CARE_PROVIDER_SITE_OTHER): Payer: Managed Care, Other (non HMO) | Admitting: *Deleted

## 2021-02-02 ENCOUNTER — Other Ambulatory Visit: Payer: Self-pay

## 2021-02-02 VITALS — BP 108/69 | HR 81 | Ht 63.0 in | Wt 222.1 lb

## 2021-02-02 DIAGNOSIS — Z4889 Encounter for other specified surgical aftercare: Secondary | ICD-10-CM

## 2021-02-02 MED ORDER — PREDNISONE 20 MG PO TABS: 40.0000 mg | ORAL_TABLET | Freq: Every day | ORAL | 0 refills | Status: AC

## 2021-02-02 NOTE — Progress Notes (Signed)
Pt presents for incision check following C/S on 4/22. She reports noticing some dampness and sensitivity on the Rt side of incision. The incision was assessed and found to have several small superficial areas on the Rt side wherein the skin was not completely closed. No redness, swelling, bleeding or active drainage was observed. Proper cleaning and would care was discussed. Pt also reports some additional concerns. She has observed some hives on her Lt flank area as well as occasionally on her abdomen. In addition, pt states that she had a rash on her buttocks after leaving the hospital. She has used Desitin ointment on the area with some relief however the area is still very itchy. Dr. Shawnie Pons in to examine pt and discuss all concerns. Silver nitrate was applied to the open areas along incision. Pt's buttocks were observed to be reddened and some scabs were present. Pt was advised to use A&D ointment or similar and Rx for prednisone was e-prescribed. Pt has PP appt on 5/23. She voiced understanding of all information and instructions given.

## 2021-02-02 NOTE — Progress Notes (Signed)
Patient seen and assessed by nursing staff.  Agree with documentation and plan. Treated incision with AgNO3. Advised short burst of prednisone to stop hives.

## 2021-02-16 ENCOUNTER — Other Ambulatory Visit (HOSPITAL_COMMUNITY)
Admission: RE | Admit: 2021-02-16 | Discharge: 2021-02-16 | Disposition: A | Discharge status: Home / Self Care | Payer: Managed Care, Other (non HMO) | Source: Ambulatory Visit | Attending: Family Medicine | Admitting: Family Medicine

## 2021-02-16 ENCOUNTER — Other Ambulatory Visit: Payer: Self-pay

## 2021-02-16 ENCOUNTER — Encounter: Payer: Self-pay | Admitting: Family Medicine

## 2021-02-16 ENCOUNTER — Ambulatory Visit (INDEPENDENT_AMBULATORY_CARE_PROVIDER_SITE_OTHER): Payer: Managed Care, Other (non HMO) | Admitting: Family Medicine

## 2021-02-16 DIAGNOSIS — B372 Candidiasis of skin and nail: Secondary | ICD-10-CM

## 2021-02-16 DIAGNOSIS — N898 Other specified noninflammatory disorders of vagina: Secondary | ICD-10-CM | POA: Insufficient documentation

## 2021-02-16 DIAGNOSIS — Z98891 History of uterine scar from previous surgery: Secondary | ICD-10-CM

## 2021-02-16 DIAGNOSIS — Z2839 Other underimmunization status: Secondary | ICD-10-CM

## 2021-02-16 DIAGNOSIS — Z6841 Body Mass Index (BMI) 40.0 and over, adult: Secondary | ICD-10-CM

## 2021-02-16 MED ORDER — NYSTATIN 100000 UNIT/GM EX POWD: 1.0000 "application " | Freq: Three times a day (TID) | CUTANEOUS | 0 refills | Status: DC

## 2021-02-16 NOTE — Progress Notes (Signed)
[facility] Partum Visit Note  Shawna Curtis is a 30 y.o. [phone] female who presents for a postpartum visit. She is 4 weeks postpartum following a repeat cesarean section.  I have fully reviewed the prenatal and intrapartum course. The delivery was at 79w5dgestational weeks.  Anesthesia: spinal. Postpartum course has been uncomplicated. Baby is doing well. Baby is feeding by both breast and bottle - Similac 360 total care. Bleeding staining only. Bowel function is normal. Bladder function is normal. Patient is not sexually active. Contraception method is none. Postpartum depression screening: negative.   The pregnancy intention screening data noted above was reviewed. Potential methods of contraception were discussed. The patient elected to proceed with Withdrawal or Other Method.    Edinburgh Postnatal Depression Scale - 02/16/21 1507      Edinburgh Postnatal Depression Scale:  In the Past 7 Days   I have been able to laugh and see the funny side of things. 0    I have looked forward with enjoyment to things. 0    I have blamed myself unnecessarily when things went wrong. 2    I have been anxious or worried for no good reason. 0    I have felt scared or panicky for no good reason. 0    Things have been getting on top of me. 1    I have been so unhappy that I have had difficulty sleeping. 0    I have felt sad or miserable. 0    I have been so unhappy that I have been crying. 0    The thought of harming myself has occurred to me. 0    Edinburgh Postnatal Depression Scale Total 3          Health Maintenance Due  Topic Date Due  . COVID-19 Vaccine (3 - Booster for Pfizer series) 01/22/2021    The following portions of the patient's history were reviewed and updated as appropriate: allergies, current medications, past family history, past medical history, past social history, past surgical history and problem list.  Review of Systems Pertinent items are noted in  HPI.  Objective:  BP 107/67   Pulse 88   Wt 223 lb (101.2 kg)   BMI 39.50 kg/m    General:  alert, cooperative and appears stated age   Breasts:  not indicated  Lungs: clear to auscultation bilaterally  Heart:  regular rate and rhythm, S1, S2 normal, no murmur, click, rub or gallop  Abdomen: soft, non-tender; bowel sounds normal; no masses,  no organomegaly   Wound well approximated incision  GU exam:  not indicated       Assessment:   Normal postpartum exam.   Plan:   Essential components of care per ACOG recommendations:  1.  Mood and well being: Patient with negative depression screening today. Reviewed local resources for support.  - Patient tobacco use? No.   - hx of drug use? No.    2. Infant care and feeding:  -Patient currently breastmilk feeding? Yes. Reviewed importance of draining breast regularly to support lactation.  -Social determinants of health (SDOH) reviewed in EPIC. No concerns  3. Sexuality, contraception and birth spacing - Patient does not want a pregnancy in the next year.  Desired family size is 4 children.  - Reviewed forms of contraception in tiered fashion. Patient desired withdrawl today.   - Discussed birth spacing of 18 months  4. Sleep and fatigue -Encouraged family/partner/community support of 4 hrs of uninterrupted sleep to  help with mood and fatigue  5. Physical Recovery  - Discussed patients delivery and complications.  - Patient had a C-section. Has sutures emerging in three places on incision  Removed sutures in clinic today - Patient has urinary incontinence? No. - Patient is safe to resume physical and sexual activity  6.  Health Maintenance - HM due items addressed Yes - Last pap smear -2021  Health Maintenance Due  Topic Date Due  . COVID-19 Vaccine (3 - Booster for Pfizer series) 01/22/2021  -Breast Cancer screening indicated? No.   7. Chronic Disease/Pregnancy Condition follow up:   1. Postpartum exam wnl  2.  Rubella non-immune status, antepartum Confirmed MMR given prior to d/c  3. BMI 40.0-44.9, adult ([place])  4. History of cesarean section RCS with next pregnancy  - PCP follow up  Caren Macadam, [place] for [name], [name]

## 2021-02-17 LAB — CERVICOVAGINAL ANCILLARY ONLY
Bacterial Vaginitis (gardnerella): NEGATIVE
Candida Glabrata: NEGATIVE
Candida Vaginitis: NEGATIVE
Chlamydia: NEGATIVE
Comment: NEGATIVE
Comment: NEGATIVE
Comment: NEGATIVE
Comment: NEGATIVE
Comment: NEGATIVE
Comment: NORMAL
Neisseria Gonorrhea: NEGATIVE
Trichomonas: NEGATIVE

## 2021-04-16 ENCOUNTER — Other Ambulatory Visit: Payer: Self-pay

## 2021-04-16 ENCOUNTER — Encounter (HOSPITAL_COMMUNITY): Payer: Self-pay

## 2021-04-16 ENCOUNTER — Ambulatory Visit (HOSPITAL_COMMUNITY)
Admission: EM | Admit: 2021-04-16 | Discharge: 2021-04-16 | Disposition: A | Discharge status: Home / Self Care | Payer: Managed Care, Other (non HMO) | Attending: Family Medicine | Admitting: Family Medicine

## 2021-04-16 DIAGNOSIS — G8929 Other chronic pain: Secondary | ICD-10-CM | POA: Diagnosis not present

## 2021-04-16 DIAGNOSIS — M545 Low back pain, unspecified: Secondary | ICD-10-CM

## 2021-04-16 MED ORDER — PREDNISONE 20 MG PO TABS
40.0000 mg | ORAL_TABLET | Freq: Every day | ORAL | 0 refills | Status: DC
Start: 2021-04-16 — End: 2022-01-18

## 2021-04-16 NOTE — Discharge Instructions (Addendum)

## 2021-04-20 ENCOUNTER — Ambulatory Visit: Payer: Managed Care, Other (non HMO) | Admitting: Family Medicine

## 2021-04-21 NOTE — ED Provider Notes (Signed)
[facility]   [id number] 04/16/21 Arrival Time: 1942  ASSESSMENT & PLAN:  1. Chronic bilateral low back pain without sciatica    Exacerbation of chronic pain. Able to ambulate here and hemodynamically stable. No indication for urgent imaging of back at this time. Discussed.  Begin trial of: Meds ordered this encounter  Medications   predniSONE (DELTASONE) 20 MG tablet    Sig: Take 2 tablets (40 mg total) by mouth daily.    Dispense:  10 tablet    Refill:  0   OTC NSAID. Encourage ROM/movement as tolerated.  Recommend:  Follow-up Information     Schedule an appointment as soon as possible for a visit  with Myra Rude, MD.   Specialties: Family Medicine, Emergency Medicine, Sports Medicine Contact information: [address] [place] [address] [phone]                 Reviewed expectations re: course of current medical issues. Questions answered. Outlined signs and symptoms indicating need for more acute intervention. Patient verbalized understanding. After Visit Summary given.   SUBJECTIVE: History from: patient.  Shawna Curtis is a 29 y.o. female who presents with complaint of fairly persistent bilateral lower back discomfort. Onset gradual. First noted  over past sev days to one week . Injury/trama: none recently. History of back problems requiring medical care: long-standing with occas exacerbations. Pain described as aching and without radiation. Aggravating factors: certain movements, prolonged walking/standing, and prolonged sitting. Alleviating factors: have not been identified. Progressive LE weakness or saddle anesthesia: none. Extremity sensation changes or weakness: none. Ambulatory without difficulty. Normal bowel/bladder habits: yes; without urinary retention. Normal PO intake without n/v. No associated abdominal pain/n/v. OTC analgesics without relief.  Reports no chronic steroid use, fevers, IV drug  use, or recent back surgeries or procedures.  OBJECTIVE:  Vitals:   04/16/21 1951  BP: 107/68  Pulse: 86  Resp: 16  Temp: 98.6 F (37 C)  TempSrc: Oral  SpO2: 96%    General appearance: alert; no distress HEENT: [place]; AT Neck: supple with FROM; without midline tenderness Lungs: unlabored respirations; speaks full sentences without difficulty Abdomen: soft, non-tender; non-distended Back: mild  and poorly localized tenderness to palpation over lumbar paraspinal musculature ; FROM at waist; bruising: none; without midline tenderness Extremities: without edema; symmetrical without gross deformities; normal ROM of bilateral LE Skin: warm and dry Neurologic: normal gait; normal sensation and strength of bilateral LE Psychological: alert and cooperative; normal mood and affect   Allergies  Allergen Reactions   Shellfish Allergy Hives    Past Medical History:  Diagnosis Date   Dyspnea    GERD (gastroesophageal reflux disease)    Headache    Keratosis pilaris    PCOS (polycystic ovarian syndrome)    Social History   Socioeconomic History   Marital status: Married    Spouse name: Not on file   Number of children: Not on file   Years of education: Not on file   Highest education level: Not on file  Occupational History   Not on file  Tobacco Use   Smoking status: Former    Types: Cigarettes    Quit date: 2010    Years since quitting: 12.5   Smokeless tobacco: Never  Vaping Use   Vaping Use: Never used  Substance and Sexual Activity   Alcohol use: Not Currently    Comment: occasionally   Drug use: Never   Sexual activity: Not Currently  Birth control/protection: None  Other Topics Concern   Not on file  Social History Narrative   Not on file   Social Determinants of Health   Financial Resource Strain: Not on file  Food Insecurity: Food Insecurity Present   Worried About Programme researcher, broadcasting/film/video in the Last Year: Never true   Ran Out of Food in the Last Year:  Sometimes true  Transportation Needs: Unmet Transportation Needs   Lack of Transportation (Medical): No   Lack of Transportation (Non-Medical): Yes  Physical Activity: Not on file  Stress: Not on file  Social Connections: Not on file  Intimate Partner Violence: Not At Risk   Fear of Current or Ex-Partner: No   Emotionally Abused: No   Physically Abused: No   Sexually Abused: No   Family History  Problem Relation Age of Onset   Asthma Mother    Hypertension Father    Diabetes Maternal Grandfather    Past Surgical History:  Procedure Laterality Date   CESAREAN SECTION     CESAREAN SECTION N/A 01/16/2021   Procedure: CESAREAN SECTION;  Surgeon: Kathrynn Running, MD;  Location: [facility] LD ORS;  Service: Obstetrics;  Laterality: N/A;   OTHER SURGICAL HISTORY Left    cyst removed from left thumb      Mardella Layman, MD 04/21/21 [id number]

## 2021-04-23 ENCOUNTER — Ambulatory Visit (HOSPITAL_BASED_OUTPATIENT_CLINIC_OR_DEPARTMENT_OTHER)
Admission: RE | Admit: 2021-04-23 | Discharge: 2021-04-23 | Disposition: A | Discharge status: Home / Self Care | Payer: Managed Care, Other (non HMO) | Source: Ambulatory Visit | Attending: Family Medicine | Admitting: Family Medicine

## 2021-04-23 ENCOUNTER — Encounter: Payer: Self-pay | Admitting: Family Medicine

## 2021-04-23 ENCOUNTER — Other Ambulatory Visit: Payer: Self-pay

## 2021-04-23 ENCOUNTER — Ambulatory Visit (INDEPENDENT_AMBULATORY_CARE_PROVIDER_SITE_OTHER): Payer: Managed Care, Other (non HMO) | Admitting: Family Medicine

## 2021-04-23 VITALS — BP 112/70 | Ht 63.0 in | Wt 220.0 lb

## 2021-04-23 DIAGNOSIS — M545 Low back pain, unspecified: Secondary | ICD-10-CM | POA: Insufficient documentation

## 2021-04-23 NOTE — Progress Notes (Signed)
Shawna Curtis - 30 y.o. female MRN [id number]  Date of birth: 06-29-1991  SUBJECTIVE:  Including CC & ROS.  No chief complaint on file.   Shawna Curtis is a 30 y.o. female that is presenting with acute on chronic low back pain.  This has been ongoing for a few weeks.  She has a history of similar pain in the past.  She did take some prednisone.  The pain is worse with certain movements and bending over.  No radicular pain.  No numbness or tingling..   Review of Systems See HPI   HISTORY: Past Medical, Surgical, Social, and Family History Reviewed & Updated per EMR.   Pertinent Historical Findings include:  Past Medical History:  Diagnosis Date   Dyspnea    GERD (gastroesophageal reflux disease)    Headache    Keratosis pilaris    PCOS (polycystic ovarian syndrome)     Past Surgical History:  Procedure Laterality Date   CESAREAN SECTION     CESAREAN SECTION N/A 01/16/2021   Procedure: CESAREAN SECTION;  Surgeon: Kathrynn Running, MD;  Location: [facility] LD [facility]S;  Service: Obstetrics;  Laterality: N/A;   OTHER SURGICAL HISTORY Left    cyst removed from left thumb    Family History  Problem Relation Age of Onset   Asthma Mother    Hypertension Father    Diabetes Maternal Grandfather     Social History   Socioeconomic History   Marital status: Married    Spouse name: Not on file   Number of children: Not on file   Years of education: Not on file   Highest education level: Not on file  Occupational History   Not on file  Tobacco Use   Smoking status: Former    Types: Cigarettes    Quit date: 2010    Years since quitting: 12.5   Smokeless tobacco: Never  Vaping Use   Vaping Use: Never used  Substance and Sexual Activity   Alcohol use: Not Currently    Comment: occasionally   Drug use: Never   Sexual activity: Not Currently    Birth control/protection: None  Other Topics Concern   Not on file  Social History Narrative   Not on file   Social  Determinants of Health   Financial Resource Strain: Not on file  Food Insecurity: Food Insecurity Present   Worried About Programme researcher, broadcasting/film/video in the Last Year: Never true   Ran Out of Food in the Last Year: Sometimes true  Transportation Needs: Unmet Transportation Needs   Lack of Transportation (Medical): No   Lack of Transportation (Non-Medical): Yes  Physical Activity: Not on file  Stress: Not on file  Social Connections: Not on file  Intimate Partner Violence: Not At Risk   Fear of Current or Ex-Partner: No   Emotionally Abused: No   Physically Abused: No   Sexually Abused: No     PHYSICAL EXAM:  VS: BP 112/70 (BP Location: Left Arm, Patient Position: Sitting, Cuff Size: Large)   Ht 5\' 3"  (1.6 m)   Wt 220 lb (99.8 kg)   BMI 38.97 kg/m  Physical Exam Gen: NAD, alert, cooperative with exam, well-appearing MSK:  Back: Limited flexion and extension. Normal strength with hip flexion. Normal gait. Negative straight leg raise. Neurovascular intact     ASSESSMENT & PLAN:   Acute bilateral low back pain without sciatica Acute on chronic in nature.  Has underlying spasm that was contributing to her severe pain.  No radicular component at this time.  She is currently breast-feeding. -Counseled on home exercise therapy and supportive care. -Counseled on heat compression. -Provided Pennsaid samples. -X-ray. -Could consider physical therapy.

## 2021-04-23 NOTE — Patient Instructions (Signed)
Nice to meet you Please try heat  Please try the exercises  Please try the compression  Please try the rub on medicine   Please send me a message in MyChart with any questions or updates.  Please see me back in 2-3 weeks.   --Dr. Jordan Likes

## 2021-04-23 NOTE — Progress Notes (Signed)
Medication Samples have been provided to the patient.  Drug name: Pennsaid       Strength: 2%        Qty: 2 boxes LOT: [id number]  Exp.Date: 03/2022  Dosing instructions: use a pea size amount  The patient has been instructed regarding the correct time, dose, and frequency of taking this medication, including desired effects and most common side effects.   Lanier Prude 10:18 AM 04/23/2021

## 2021-04-23 NOTE — Assessment & Plan Note (Signed)
Acute on chronic in nature.  Has underlying spasm that was contributing to her severe pain.  No radicular component at this time.  She is currently breast-feeding. -Counseled on home exercise therapy and supportive care. -Counseled on heat compression. -Provided Pennsaid samples. -X-ray. -Could consider physical therapy.

## 2021-04-27 ENCOUNTER — Telehealth: Payer: Self-pay | Admitting: Family Medicine

## 2021-04-27 NOTE — Telephone Encounter (Signed)
Informed of results.   Myra Rude, MD [facility] 04/27/2021, 5:17 PM

## 2021-05-12 ENCOUNTER — Ambulatory Visit: Payer: Managed Care, Other (non HMO) | Admitting: Family Medicine

## 2021-05-20 ENCOUNTER — Ambulatory Visit: Payer: Managed Care, Other (non HMO) | Admitting: Family Medicine

## 2021-05-20 NOTE — Progress Notes (Deleted)
  Shawna Curtis - 30 y.o. female MRN [id number]  Date of birth: 1991/08/03  SUBJECTIVE:  Including CC & ROS.  No chief complaint on file.   Shawna Curtis is a 30 y.o. female that is  ***.  ***   Review of Systems See HPI   HISTORY: Past Medical, Surgical, Social, and Family History Reviewed & Updated per EMR.   Pertinent Historical Findings include:  Past Medical History:  Diagnosis Date   Dyspnea    GERD (gastroesophageal reflux disease)    Headache    Keratosis pilaris    PCOS (polycystic ovarian syndrome)     Past Surgical History:  Procedure Laterality Date   CESAREAN SECTION     CESAREAN SECTION N/A 01/16/2021   Procedure: CESAREAN SECTION;  Surgeon: Kathrynn Running, MD;  Location: [facility];  Service: Obstetrics;  Laterality: N/A;   OTHER SURGICAL HISTORY Left    cyst removed from left thumb    Family History  Problem Relation Age of Onset   Asthma Mother    Hypertension Father    Diabetes Maternal Grandfather     Social History   Socioeconomic History   Marital status: Married    Spouse name: Not on file   Number of children: Not on file   Years of education: Not on file   Highest education level: Not on file  Occupational History   Not on file  Tobacco Use   Smoking status: Former    Types: Cigarettes    Quit date: 2010    Years since quitting: 12.6   Smokeless tobacco: Never  Vaping Use   Vaping Use: Never used  Substance and Sexual Activity   Alcohol use: Not Currently    Comment: occasionally   Drug use: Never   Sexual activity: Not Currently    Birth control/protection: None  Other Topics Concern   Not on file  Social History Narrative   Not on file   Social Determinants of Health   Financial Resource Strain: Not on file  Food Insecurity: Food Insecurity Present   Worried About Programme researcher, broadcasting/film/video in the Last Year: Never true   Ran Out of Food in the Last Year: Sometimes true  Transportation Needs: Unmet  Transportation Needs   Lack of Transportation (Medical): No   Lack of Transportation (Non-Medical): Yes  Physical Activity: Not on file  Stress: Not on file  Social Connections: Not on file  Intimate Partner Violence: Not At Risk   Fear of Current or Ex-Partner: No   Emotionally Abused: No   Physically Abused: No   Sexually Abused: No     PHYSICAL EXAM:  VS: There were no vitals taken for this visit. Physical Exam Gen: NAD, alert, cooperative with exam, well-appearing MSK:  ***      ASSESSMENT & PLAN:   No problem-specific Assessment & Plan notes found for this encounter.

## 2021-05-22 IMAGING — US US MFM OB FOLLOW-UP
1 series · 13 of 28 positions shown · non-contrast
Comparison: none

[Series 1: us mfm ob follow-up · 30 acquisitions, 13 frames shown]
[im 2/30]
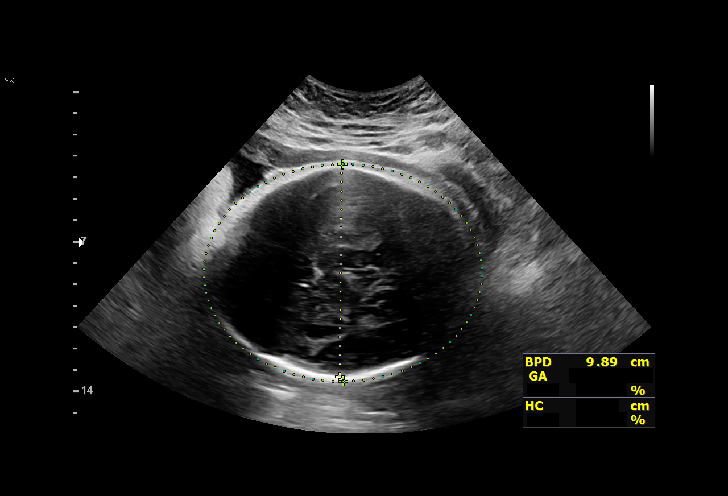
[im 4/30]
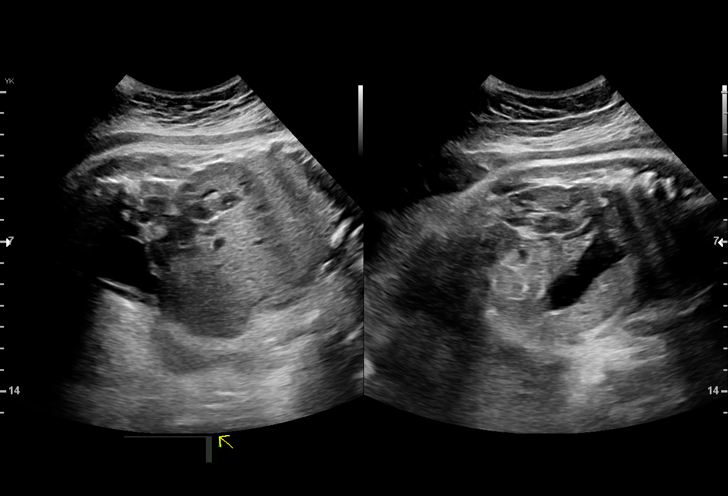
[im 6/30]
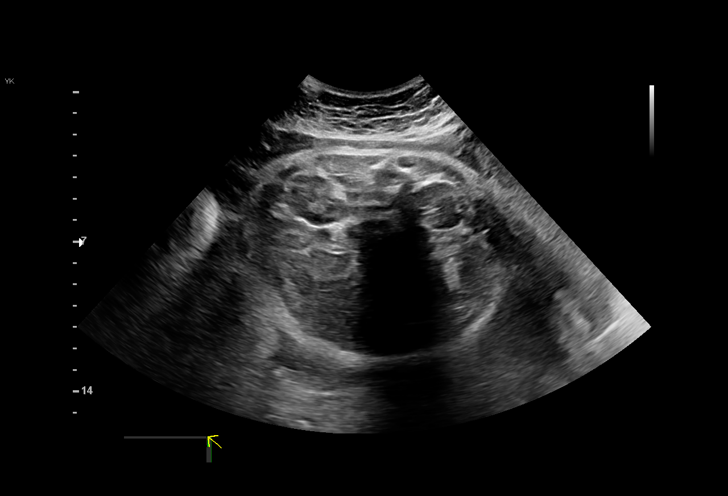
[im 8/30]
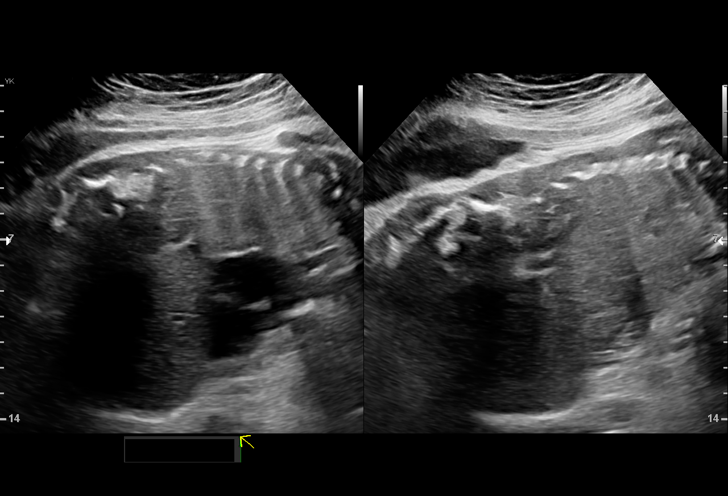
[im 10/30]
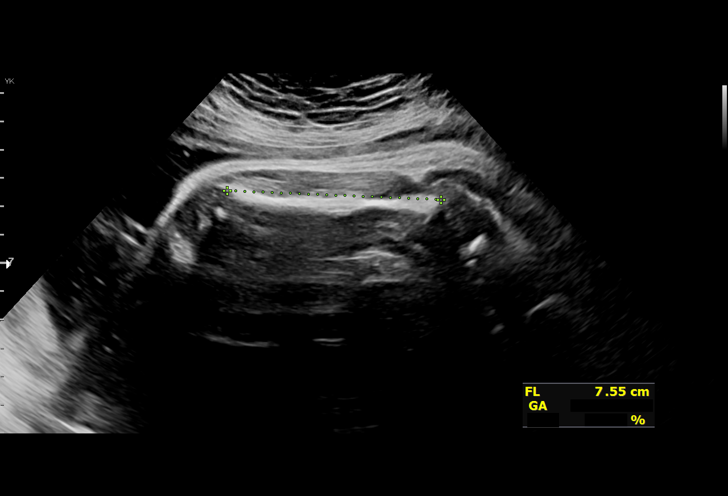
[im 12/30]
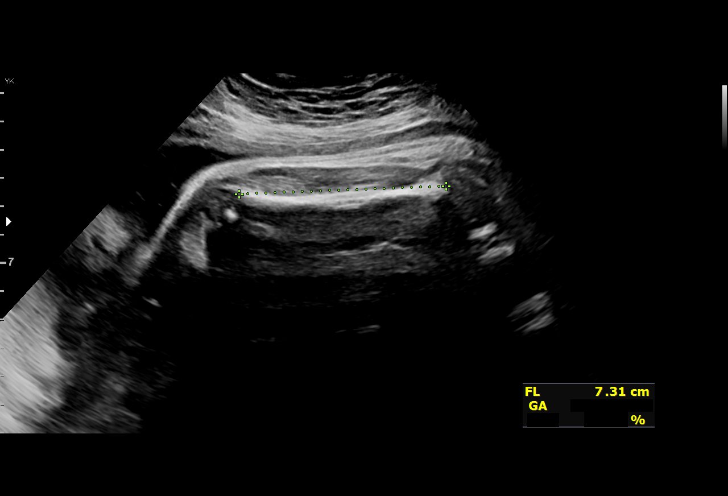
[im 16/30]
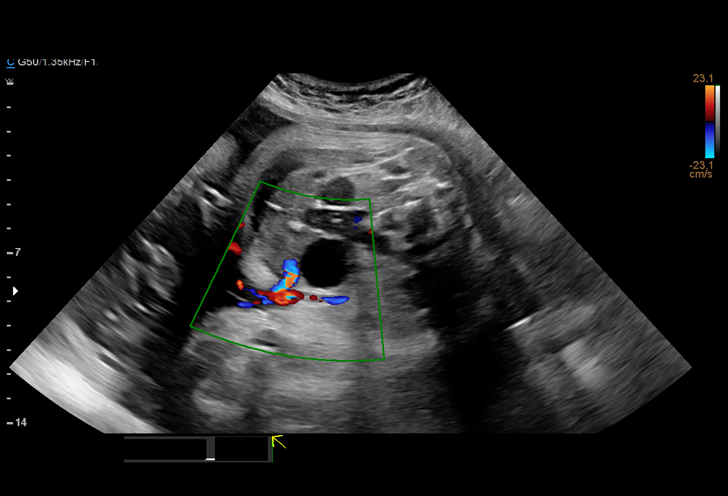
[im 18/30]
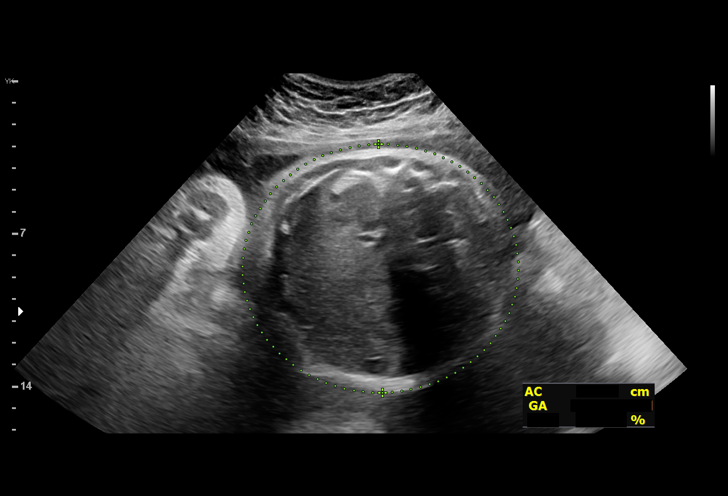
[im 20/30]
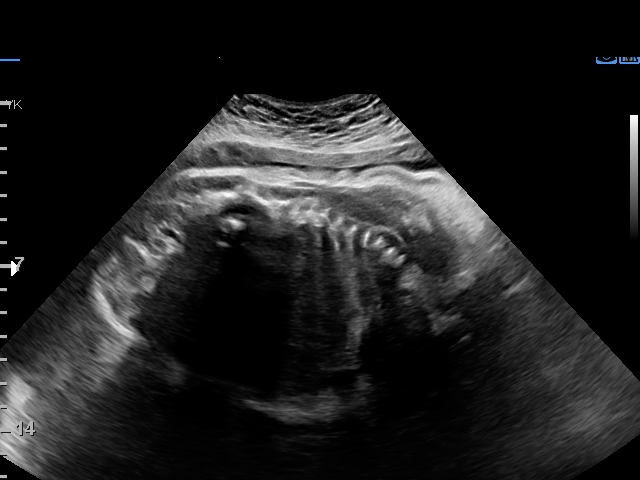
[im 22/30]
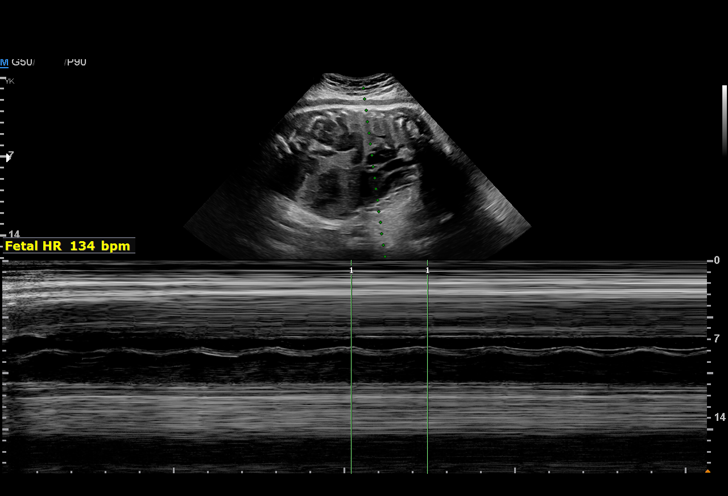
[im 24/30]
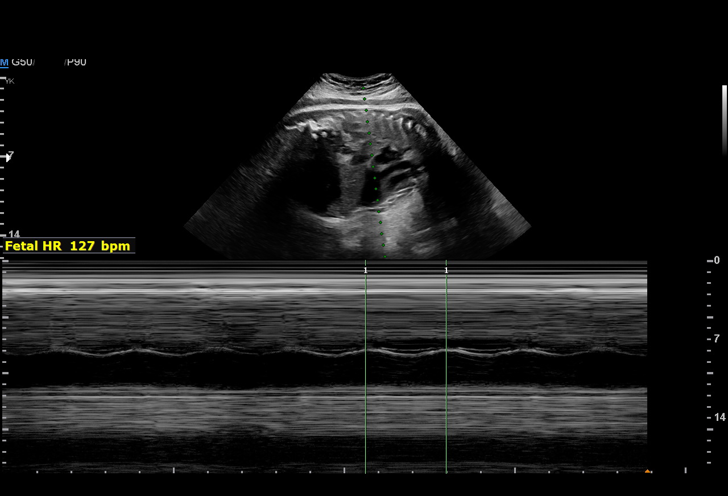
[im 26/30]
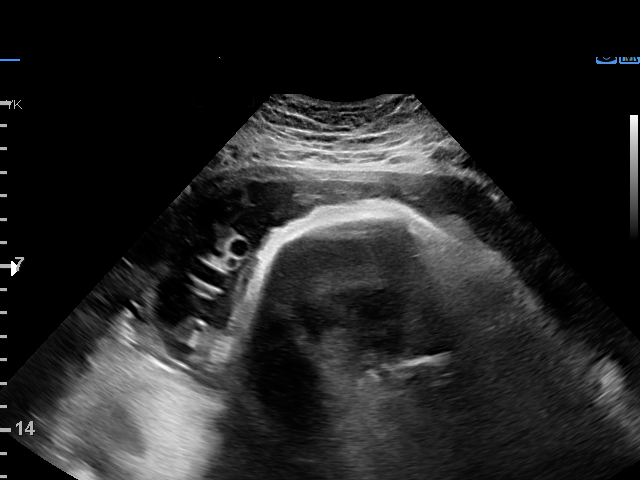
[im 28/30]
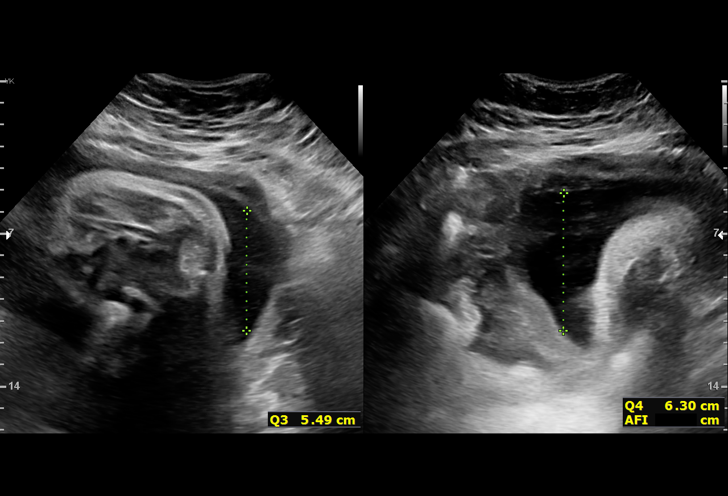

[13 of 28 positions shown; findings below may reference images not displayed]

[id number]

Indications

 Suspected macroscopic fetus
 History of cesarean delivery, currently
 pregnant
 Encounter for other antenatal screening
 follow-up
 36 weeks gestation of pregnancy
Fetal Evaluation

 Num Of Fetuses:         1
 Fetal Heart Rate(bpm):  134
 Cardiac Activity:       Observed
 Presentation:           Cephalic
 Placenta:               Posterior
 P. Cord Insertion:      Previously Visualized

 Amniotic Fluid
 AFI FV:      Within normal limits

 AFI Sum(cm)     %Tile       Largest Pocket(cm)
 21.             80

 RUQ(cm)       RLQ(cm)       LUQ(cm)        LLQ(cm)

Biometry
 BPD:      98.6  mm     G. Age:  40w 3d       > 99  %    CI:        74.46   %    70 - 86
                                                         FL/HC:      19.7   %    20.8 -
 HC:      362.7  mm     G. Age:  42w 6d       > 99  %    HC/AC:      0.96        0.92 -
 AC:      376.6  mm     G. Age:  41w 4d       > 99  %    FL/BPD:     72.6   %    71 - 87
 FL:       71.6  mm     G. Age:  36w 5d         50  %    FL/AC:      19.0   %    20 - 24

 Est. FW:    6330  gm      9 lb 3 oz   > 99  %
OB History

 Gravidity:    4         Term:   1        Prem:   0        SAB:   2
 TOP:          0       Ectopic:  0        Living: 1
Gestational Age

 LMP:           36w 4d        Date:  04/20/20                 EDD:   01/25/21
 U/S Today:     40w 3d                                        EDD:   12/29/20
 Best:          36w 4d     Det. By:  LMP  (04/20/20)          EDD:   01/25/21
Anatomy

 Cranium:               Appears normal         LVOT:                   Previously seen
 Cavum:                 Previously seen        Aortic Arch:            Previously seen
 Ventricles:            Previously seen        Ductal Arch:            Previously seen
 Choroid Plexus:        Previously seen        Diaphragm:              Previously seen
 Cerebellum:            Previously seen        Stomach:                Appears normal
 Posterior Fossa:       Previously seen        Abdomen:                Appears normal
 Nuchal Fold:           Not applicable (>20    Abdominal Wall:         Not well visualized
                        wks GA)
 Face:                  Orbits and profile     Cord Vessels:           Appears normal (3
                        previously seen                                vessel cord)
 Lips:                  Previously seen        Kidneys:                Appear normal
 Palate:                Previously seen        Bladder:                Appears normal
 Thoracic:              Appears normal         Spine:                  Previously seen
 Heart:                 Previously seen        Upper Extremities:      Previously seen
 RVOT:                  Not well visualized    Lower Extremities:      Previously seen

 Other:  Heels, 5th digit, Lenses, and Nasal bone previously visualized. Fetus
         appears to be a male. Technically difficult due to fetal position.
Cervix Uterus Adnexa

 Cervix
 Not visualized (advanced GA >73wks)
Impression

 Follow up growth due to suspected size greater than dates.
 Normal interval growth with measurements large for dates
 with EFW 99th% along with AC, BDP and HC.
 Good fetal movement and amniotic fluid volume

 I discussed today's visit with Ms. Maarif she notes that
 she has a normal 1hr GTT. In addition, she had a prior
 cesarean delivery for cephalopelvic disproportion. The infant
 weighed ^ 8lb at the time of devlivery. She also notes that
 she delivered that child in [place] and she never labored.
 She is considering a TOLAC this pregnancy. I explained that
 at this time there are no contraindication for delivery, given
 that she did not labor, however, given that this fetus is
 measuring 9lb at 36 weeks, I expressed concern regarding
 an IOL of labor and that her chances of a failed TOLAC is
 high given her prior indication of CPD and fetal size. I also
 expressed I would not recommend an operative delivery or to
 persist in labor if her labor curve diverges from what is
 reasonable. I also expressed given the fetal size an repeat
 cesarean delivery should be considered at 39 weeks unless
 she intiates labor spontaneously.
Recommendations

 Follow up as clinically indicated.

## 2021-05-28 ENCOUNTER — Other Ambulatory Visit: Payer: Self-pay

## 2021-05-28 ENCOUNTER — Encounter: Payer: Self-pay | Admitting: Family Medicine

## 2021-05-28 ENCOUNTER — Ambulatory Visit: Payer: Managed Care, Other (non HMO) | Admitting: Family Medicine

## 2021-05-28 VITALS — Ht 63.0 in | Wt 220.0 lb

## 2021-05-28 DIAGNOSIS — M545 Low back pain, unspecified: Secondary | ICD-10-CM

## 2021-05-28 NOTE — Progress Notes (Signed)
  Shawna Curtis - 30 y.o. female MRN [id number]  Date of birth: December 23, 1990  SUBJECTIVE:  Including CC & ROS.  No chief complaint on file.   Shawna Curtis is a 30 y.o. female that is following up for her low back pain.  She is doing well with no significant pain.  She has intermittent stiffness from time to time..    Review of Systems See HPI   HISTORY: Past Medical, Surgical, Social, and Family History Reviewed & Updated per EMR.   Pertinent Historical Findings include:  Past Medical History:  Diagnosis Date   Dyspnea    GERD (gastroesophageal reflux disease)    Headache    Keratosis pilaris    PCOS (polycystic ovarian syndrome)     Past Surgical History:  Procedure Laterality Date   CESAREAN SECTION     CESAREAN SECTION N/A 01/16/2021   Procedure: CESAREAN SECTION;  Surgeon: Kathrynn Running, MD;  Location: [facility];  Service: Obstetrics;  Laterality: N/A;   OTHER SURGICAL HISTORY Left    cyst removed from left thumb    Family History  Problem Relation Age of Onset   Asthma Mother    Hypertension Father    Diabetes Maternal Grandfather     Social History   Socioeconomic History   Marital status: Married    Spouse name: Not on file   Number of children: Not on file   Years of education: Not on file   Highest education level: Not on file  Occupational History   Not on file  Tobacco Use   Smoking status: Former    Types: Cigarettes    Quit date: 2010    Years since quitting: 12.6   Smokeless tobacco: Never  Vaping Use   Vaping Use: Never used  Substance and Sexual Activity   Alcohol use: Not Currently    Comment: occasionally   Drug use: Never   Sexual activity: Not Currently    Birth control/protection: None  Other Topics Concern   Not on file  Social History Narrative   Not on file   Social Determinants of Health   Financial Resource Strain: Not on file  Food Insecurity: Food Insecurity Present   Worried About Patent examiner in the Last Year: Never true   Ran Out of Food in the Last Year: Sometimes true  Transportation Needs: Unmet Transportation Needs   Lack of Transportation (Medical): No   Lack of Transportation (Non-Medical): Yes  Physical Activity: Not on file  Stress: Not on file  Social Connections: Not on file  Intimate Partner Violence: Not At Risk   Fear of Current or Ex-Partner: No   Emotionally Abused: No   Physically Abused: No   Sexually Abused: No     PHYSICAL EXAM:  VS: Ht 5\' 3"  (1.6 m)   Wt 220 lb (99.8 kg)   BMI 38.97 kg/m  Physical Exam Gen: NAD, alert, cooperative with exam, well-appearing       ASSESSMENT & PLAN:   Acute bilateral low back pain without sciatica Has gotten improvement.  Has stiffness intermittently. -Counseled on home exercise therapy and supportive care. -Could consider physical therapy.

## 2021-05-28 NOTE — Patient Instructions (Signed)
Good to see you ?Please try heat  ?Please try the exercises   ?Please send me a message in MyChart with any questions or updates.  ?Please see me back as needed.  ? ?--Dr. Daesia Zylka ? ?

## 2021-05-29 NOTE — Assessment & Plan Note (Signed)
Has gotten improvement.  Has stiffness intermittently. -Counseled on home exercise therapy and supportive care. -Could consider physical therapy.

## 2022-01-18 ENCOUNTER — Ambulatory Visit
Admission: EM | Admit: 2022-01-18 | Discharge: 2022-01-18 | Disposition: A | Discharge status: Home / Self Care | Payer: Managed Care, Other (non HMO) | Attending: Emergency Medicine | Admitting: Emergency Medicine

## 2022-01-18 ENCOUNTER — Ambulatory Visit: Payer: Self-pay | Admitting: *Deleted

## 2022-01-18 DIAGNOSIS — R058 Other specified cough: Secondary | ICD-10-CM

## 2022-01-18 DIAGNOSIS — R062 Wheezing: Secondary | ICD-10-CM

## 2022-01-18 DIAGNOSIS — J302 Other seasonal allergic rhinitis: Secondary | ICD-10-CM

## 2022-01-18 DIAGNOSIS — H6993 Unspecified Eustachian tube disorder, bilateral: Secondary | ICD-10-CM

## 2022-01-18 DIAGNOSIS — H6983 Other specified disorders of Eustachian tube, bilateral: Secondary | ICD-10-CM | POA: Diagnosis not present

## 2022-01-18 MED ORDER — ALBUTEROL SULFATE HFA 108 (90 BASE) MCG/ACT IN AERS: 2.0000 | INHALATION_SPRAY | Freq: Four times a day (QID) | RESPIRATORY_TRACT | 0 refills | Status: AC | PRN

## 2022-01-18 MED ORDER — IBUPROFEN 400 MG PO TABS: 400.0000 mg | ORAL_TABLET | Freq: Three times a day (TID) | ORAL | 0 refills | Status: DC | PRN

## 2022-01-18 MED ORDER — FEXOFENADINE HCL 180 MG PO TABS: 180.0000 mg | ORAL_TABLET | Freq: Every day | ORAL | 1 refills | Status: DC

## 2022-01-18 MED ORDER — FLUTICASONE PROPIONATE 50 MCG/ACT NA SUSP: 1.0000 | Freq: Every day | NASAL | 1 refills | Status: DC

## 2022-01-18 NOTE — Telephone Encounter (Signed)
Reason for Disposition ?? [1] MILD difficulty breathing (e.g., minimal/no SOB at rest, SOB with walking, pulse <100) AND [2] NEW-onset or WORSE than normal ? ?Answer Assessment - Initial Assessment Questions ?1. RESPIRATORY STATUS: "Describe your breathing?" (e.g., wheezing, shortness of breath, unable to speak, severe coughing)  ?   CAn't get deep breath ?2. ONSET: "When did this breathing problem begin?"  ?    12/29/21 ?3. PATTERN "Does the difficult breathing come and go, or has it been constant since it started?"  ?    Intermittent ?4. SEVERITY: "How bad is your breathing?" (e.g., mild, moderate, severe)  ?  - MILD: No SOB at rest, mild SOB with walking, speaks normally in sentences, can lie down, no retractions, pulse < 100.  ?  - MODERATE: SOB at rest, SOB with minimal exertion and prefers to sit, cannot lie down flat, speaks in phrases, mild retractions, audible wheezing, pulse 100-120.  ?  - SEVERE: Very SOB at rest, speaks in single words, struggling to breathe, sitting hunched forward, retractions, pulse > 120  ?    Mild with exertion, some wheezing noted yesterday ?5. RECURRENT SYMPTOM: "Have you had difficulty breathing before?" If Yes, ask: "When was the last time?" and "What happened that time?"  ?    no ?6. CARDIAC HISTORY: "Do you have any history of heart disease?" (e.g., heart attack, angina, bypass surgery, angioplasty)  ?    no ?7. LUNG HISTORY: "Do you have any history of lung disease?"  (e.g., pulmonary embolus, asthma, emphysema) ?    no ?8. CAUSE: "What do you think is causing the breathing problem?"  ?    Allergies ?9. OTHER SYMPTOMS: "Do you have any other symptoms? (e.g., dizziness, runny nose, cough, chest pain, fever) ?    Stuff nose, cough, mostly dry, flecks of phlegm , whitish green ? ?Protocols used: Breathing Difficulty-A-AH ? ?

## 2022-01-18 NOTE — ED Provider Notes (Signed)
?[facility] ? ? ? ?CSN: [id number] ?Arrival date & time: 01/18/22  1311 ?  ? ?HISTORY  ? ?Chief Complaint  ?Patient presents with  ? Cough  ? ?HPI ?Shawna Curtis is a 31 y.o. female. Pt c/o dry cough for 3 weeks and starting to get SOB with wheezing. Patient does not display signs/ symptoms of respiratory distress, vital signs are normal on arrival with the exception of elevated blood pressure.  Patient states he is breast-feeding at this time, not currently taking allergy medications although she does admit to a history of allergies.  Patient denies fever, aches, chills, nausea, vomiting, diarrhea. ? ?The history is provided by the patient.  ?Past Medical History:  ?Diagnosis Date  ? Dyspnea   ? GERD (gastroesophageal reflux disease)   ? Headache   ? Keratosis pilaris   ? PCOS (polycystic ovarian syndrome)   ? ?Patient Active Problem List  ? Diagnosis Date Noted  ? Acute bilateral low back pain without sciatica 04/23/2021  ? History of cesarean section 01/16/2021  ? Rubella non-immune status, antepartum 11/04/2020  ? Supervision of high risk pregnancy, antepartum 10/28/2020  ? BMI 40.0-44.9, adult (HCC) 10/28/2020  ? ?Past Surgical History:  ?Procedure Laterality Date  ? CESAREAN SECTION    ? CESAREAN SECTION N/A 01/16/2021  ? Procedure: CESAREAN SECTION;  Surgeon: Kathrynn Running, MD;  Location: [facility];  Service: Obstetrics;  Laterality: N/A;  ? OTHER SURGICAL HISTORY Left   ? cyst removed from left thumb  ? ?OB History   ? ? Gravida  ?3  ? Para  ?1  ? Term  ?1  ? Preterm  ?   ? AB  ?2  ? Living  ?2  ?  ? ? SAB  ?2  ? IAB  ?   ? Ectopic  ?   ? Multiple  ?1  ? Live Births  ?2  ?   ?  ?  ? ?Home Medications   ? ?Prior to Admission medications   ?Medication Sig Start Date End Date Taking? Authorizing Provider  ?acetaminophen (TYLENOL) 325 MG tablet Take 2 tablets (650 mg total) by mouth every 4 (four) hours as needed for mild pain (temperature > 101.5.). ?Patient not taking: No sig reported  01/19/21   Rasch, Victorino Dike I, NP  ?diphenhydrAMINE-zinc acetate (BENADRYL ITCH STOPPING) cream Apply topically 3 (three) times daily as needed for itching. ?Patient not taking: No sig reported 01/26/21   Hermina Staggers, MD  ?ibuprofen (ADVIL) 600 MG tablet Take 1 tablet (600 mg total) by mouth every 6 (six) hours as needed for fever or headache. ?Patient not taking: No sig reported 01/19/21   Rasch, Victorino Dike I, NP  ?nystatin (MYCOSTATIN/NYSTOP) powder Apply 1 application topically 3 (three) times daily. 02/16/21   Federico Flake, MD  ?predniSONE (DELTASONE) 20 MG tablet Take 2 tablets (40 mg total) by mouth daily. 04/16/21   Mardella Layman, MD  ?Prenatal Vit-Fe Fumarate-FA (PRENATAL VITAMINS PO) Take 1 capsule by mouth daily. ?Patient not taking: Reported on 02/16/2021    [provider]  ? ?Family History ?Family History  ?Problem Relation Age of Onset  ? Asthma Mother   ? Hypertension Father   ? Diabetes Maternal Grandfather   ? ?Social History ?Social History  ? ?Tobacco Use  ? Smoking status: Former  ?  Types: Cigarettes  ?  Quit date: 2010  ?  Years since quitting: 13.3  ? Smokeless tobacco: Never  ?Vaping Use  ? Vaping  Use: Never used  ?Substance Use Topics  ? Alcohol use: Not Currently  ?  Comment: occasionally  ? Drug use: Never  ? ?Allergies   ?Shellfish allergy ? ?Review of Systems ?Review of Systems ?Pertinent findings noted in history of present illness.  ? ?Physical Exam ?Triage Vital Signs ?ED Triage Vitals  ?Enc Vitals Group  ?   BP 07/24/21 0827 (!) 147/82  ?   Pulse Rate 07/24/21 0827 72  ?   Resp 07/24/21 0827 18  ?   Temp 07/24/21 0827 98.3 ?F (36.8 ?C)  ?   Temp Source 07/24/21 0827 Oral  ?   SpO2 07/24/21 0827 98 %  ?   Weight --   ?   Height --   ?   Head Circumference --   ?   Peak Flow --   ?   Pain Score 07/24/21 0826 5  ?   Pain Loc --   ?   Pain Edu? --   ?   Excl. in GC? --   ?No data found. ? ?Updated Vital Signs ?BP (!) 159/81 (BP Location: Right Arm)   Pulse 69   Temp 98.7  ?F (37.1 ?C) (Oral)   Resp 18   LMP  (LMP Unknown) Comment: patient states she does not remember when LMP was  SpO2 98%   Breastfeeding Yes  ? ?Physical Exam ?Vitals and nursing note reviewed.  ?Constitutional:   ?   General: She is not in acute distress. ?   Appearance: Normal appearance. She is not ill-appearing.  ?HENT:  ?   Head: Normocephalic and atraumatic.  ?   Salivary Glands: Right salivary gland is not diffusely enlarged or tender. Left salivary gland is not diffusely enlarged or tender.  ?   Right Ear: Ear canal and external ear normal. No drainage. A middle ear effusion is present. There is no impacted cerumen. Tympanic membrane is bulging. Tympanic membrane is not injected or erythematous.  ?   Left Ear: Ear canal and external ear normal. No drainage. A middle ear effusion is present. There is no impacted cerumen. Tympanic membrane is bulging. Tympanic membrane is not injected or erythematous.  ?   Ears:  ?   Comments: Bilateral EACs normal, both TMs bulging with clear fluid ?   Nose: Rhinorrhea present. No nasal deformity, septal deviation, signs of injury, nasal tenderness, mucosal edema or congestion. Rhinorrhea is clear.  ?   Right Nostril: Occlusion present. No foreign body, epistaxis or septal hematoma.  ?   Left Nostril: Occlusion present. No foreign body, epistaxis or septal hematoma.  ?   Right Turbinates: Enlarged, swollen and pale.  ?   Left Turbinates: Enlarged, swollen and pale.  ?   Right Sinus: No maxillary sinus tenderness or frontal sinus tenderness.  ?   Left Sinus: No maxillary sinus tenderness or frontal sinus tenderness.  ?   Mouth/Throat:  ?   Lips: Pink. No lesions.  ?   Mouth: Mucous membranes are moist. No oral lesions.  ?   Pharynx: Oropharynx is clear. Uvula midline. No posterior oropharyngeal erythema or uvula swelling.  ?   Tonsils: No tonsillar exudate. 0 on the right. 0 on the left.  ?   Comments: Postnasal drip ?Eyes:  ?   General: Lids are normal.     ?   Right eye:  No discharge.     ?   Left eye: No discharge.  ?   Extraocular Movements: Extraocular movements intact.  ?  Conjunctiva/sclera: Conjunctivae normal.  ?   Right eye: Right conjunctiva is not injected.  ?   Left eye: Left conjunctiva is not injected.  ?Neck:  ?   Trachea: Trachea and phonation normal.  ?Cardiovascular:  ?   Rate and Rhythm: Normal rate and regular rhythm.  ?   Pulses: Normal pulses.  ?   Heart sounds: Normal heart sounds. No murmur heard. ?  No friction rub. No gallop.  ?Pulmonary:  ?   Effort: Pulmonary effort is normal. No accessory muscle usage, prolonged expiration or respiratory distress.  ?   Breath sounds: No stridor, decreased air movement or transmitted upper airway sounds. Examination of the right-upper field reveals wheezing. Examination of the left-upper field reveals wheezing. Examination of the right-middle field reveals wheezing. Examination of the left-middle field reveals wheezing. Wheezing present. No decreased breath sounds, rhonchi or rales.  ?Chest:  ?   Chest wall: No tenderness.  ?Musculoskeletal:     ?   General: Normal range of motion.  ?   Cervical back: Normal range of motion and neck supple. Normal range of motion.  ?Lymphadenopathy:  ?   Cervical: No cervical adenopathy.  ?Skin: ?   General: Skin is warm and dry.  ?   Findings: No erythema or rash.  ?Neurological:  ?   General: No focal deficit present.  ?   Mental Status: She is alert and oriented to person, place, and time.  ?Psychiatric:     ?   Mood and Affect: Mood normal.     ?   Behavior: Behavior normal.  ? ? ?Visual Acuity ?Right Eye Distance:   ?Left Eye Distance:   ?Bilateral Distance:   ? ?Right Eye Near:   ?Left Eye Near:    ?Bilateral Near:    ? ?UC Couse / Diagnostics / Procedures:  ?  ?EKG ? ?Radiology ?No results found. ? ?Procedures ?Procedures (including critical care time) ? ?UC Diagnoses / Final Clinical Impressions(s)   ?I have reviewed the triage vital signs and the nursing notes. ? ?Pertinent labs  & imaging results that were available during my care of the patient were reviewed by me and considered in my medical decision making (see chart for details).   ?Final diagnoses:  ?Mother currently breast-

## 2022-01-18 NOTE — Discharge Instructions (Signed)
Your symptoms and my physical exam findings are concerning for exacerbation of your underlying allergies.  It is important that you begin your allergy regimen now and are consistent with taking allergy medications exactly as prescribed.   ?  ?Advil, Motrin (ibuprofen): This is a good anti-inflammatory medication which not only addresses aches, pains but also significantly reduces soft tissue inflammation of the upper airways that causes sinus and nasal congestion as well as inflammation of the lower airways which makes you feel like your breathing is constricted or your cough feel tight.  I recommend that you take between 400 mg every 8 hours as needed.    ?  ?Allegra (fexofenadine): This is an excellent second-generation antihistamine that helps to reduce respiratory inflammatory response to environmental allergens.  This medication is not known to cause daytime sleepiness so it can be taken in the daytime.  If you find that it does make you sleepy, please feel free to take it at bedtime.  Because you are well-established with breast-feeding, it is safe to begin taking an antihistamine.  This will not dry up your breastmilk. ?  ?Flonase (fluticasone): This is a steroid nasal spray that you use once daily, 1 spray in each nare.  This medication does not work well if you decide to use it only used as you feel you need to, it works best used on a daily basis.  After 3 to 5 days of use, you will notice significant reduction of the inflammation and mucus production that is currently being caused by exposure to allergens, whether seasonal or environmental.  The most common side effect of this medication is nosebleeds.  If you experience a nosebleed, please discontinue use for 1 week, then feel free to resume.  This medication is minimally absorbed into the body, if at all, is not known to be present in breastmilk whatsoever. ? ?Decadron IM (dexamethasone):  To quickly address your significant respiratory inflammation,  you were offered an injection of Decadron in the office today.   I absolutely respect your decision to not take steroids at this time because you are still breast-feeding.  Please feel free to contact us here at the clinic if, after several days of the above medications you have not had meaningful improvement of your symptoms.  Because the injection does provide rapid relief and you will have already been on several days of antihistamines and nonsteroidal anti-inflammatories, to avoid the presence of steroids in your breastmilk I recommend the injection versus taking a tablet for several days. ? ?Thank you for visiting urgent care today.  We appreciate the opportunity to participate in your care. ? ?

## 2022-01-18 NOTE — Telephone Encounter (Signed)
?  Chief Complaint: "Allergies" ?Symptoms: "Can't get a deep breathe at times" Some wheezing at times, stuffy nose, cough, mostly dry, flecks of greenish white phlegm.  ?Frequency: 12/29/21 ?Pertinent Negatives: Patient denies fever ?Disposition: [] ED /[] Urgent Care (no appt availability in office) / [x] Appointment(In office/virtual)/ []  [facility]/ [] Home Care/ [] Refused Recommended Disposition /[] [facility]/ []  Follow-up with PCP ?Additional Notes: Appt secured for tomorrow AM. Care advise provided, pt verbalizes understanding. ?

## 2022-01-18 NOTE — ED Triage Notes (Signed)
Pt c/o dry cough for 3 weeks and starting to get SOB with wheezing. Patient does not display signs/ symptoms of respiratory distress.   ?

## 2022-01-19 ENCOUNTER — Ambulatory Visit: Payer: Managed Care, Other (non HMO) | Admitting: Physician Assistant

## 2022-05-07 ENCOUNTER — Ambulatory Visit: Payer: Managed Care, Other (non HMO) | Admitting: Nurse Practitioner

## 2022-05-07 ENCOUNTER — Encounter: Payer: Self-pay | Admitting: Nurse Practitioner

## 2022-05-07 VITALS — BP 104/78 | HR 72 | Temp 97.2°F | Wt 215.0 lb

## 2022-05-07 DIAGNOSIS — L858 Other specified epidermal thickening: Secondary | ICD-10-CM

## 2022-05-07 DIAGNOSIS — Z0000 Encounter for general adult medical examination without abnormal findings: Secondary | ICD-10-CM

## 2022-05-07 DIAGNOSIS — Z13220 Encounter for screening for lipoid disorders: Secondary | ICD-10-CM

## 2022-05-07 DIAGNOSIS — E669 Obesity, unspecified: Secondary | ICD-10-CM

## 2022-05-07 DIAGNOSIS — E282 Polycystic ovarian syndrome: Secondary | ICD-10-CM

## 2022-05-07 DIAGNOSIS — J302 Other seasonal allergic rhinitis: Secondary | ICD-10-CM | POA: Diagnosis not present

## 2022-05-07 MED ORDER — TRIAMCINOLONE ACETONIDE 0.1 % EX CREA: 1.0000 | TOPICAL_CREAM | Freq: Two times a day (BID) | CUTANEOUS | 1 refills | Status: DC

## 2022-05-07 NOTE — Assessment & Plan Note (Addendum)
She has a history of PCOS and since the birth of her last child in 12/2020, she has been having irregular periods she states that her last menstrual period was about 3 months ago.  She is still breast-feeding, which may play a role in her menstrual cycles.  We will check FSH, LH, testosterone, urine pregnancy, and A1c.  Follow-up in 2 to 3 months.

## 2022-05-07 NOTE — Progress Notes (Signed)
New Patient Visit  BP 104/78   Pulse 72   Temp (!) 97.2 F (36.2 C) (Temporal)   Wt 215 lb (97.5 kg)   SpO2 96%   BMI 38.09 kg/m    Subjective:    Patient ID: Shawna Curtis, female    DOB: May 04, 1991, 31 y.o.   MRN: [id number]  CC: Chief Complaint  Patient presents with   Establish Care    Np. Est care. Overall health assessment.     HPI: Shawna Curtis is a 31 y.o. female presents for new patient visit to establish care.  Introduced to Publishing rights manager role and practice setting.  All questions answered.  Discussed provider/patient relationship and expectations.  Moved from [place] about 1-1/2 years ago with her husband and children for his job.  She is looking to establish care with a primary care provider.  She has a history of seasonal allergies and will take Allegra as needed.  When her symptoms act up, she endorses nasal congestion, sneezing, itchy/red eyes.  Her symptoms are well controlled with Allegra as needed.  She has also been experiencing some blurry vision.  She states that this started a few months ago.  She went to an eye doctor about 2 years ago.  She denies eye pain.  She has only had 1 menstrual period since the birth of her last child 12/2020. She is still breast feeding. She does have irregular periods about 2-3 months apart before her pregnancy.  She also states that she has a history of PCOS.  Depression and Anxiety Screen done:     05/07/2022    4:01 PM 02/02/2021    4:28 PM 01/07/2021    4:09 PM 12/31/2020    2:38 PM 12/17/2020    4:53 PM  Depression screen PHQ 2/9  Decreased Interest 1 0 0 0 0  Down, Depressed, Hopeless 1 0 0 0 0  PHQ - 2 Score 2 0 0 0 0  Altered sleeping 1 0 1 0 0  Tired, decreased energy 1 0 1 1 1   Change in appetite 1 0 0 0 0  Feeling bad or failure about yourself  0 0 0 0 0  Trouble concentrating 0 0 0 0 0  Moving slowly or fidgety/restless 0 0 0 0 0  Suicidal thoughts 0 0 0 0 0  PHQ-9 Score 5 0 2 1 1    Difficult doing work/chores Not difficult at all          05/07/2022    4:01 PM 02/02/2021    4:29 PM 01/07/2021    4:09 PM 12/31/2020    2:39 PM  GAD 7 : Generalized Anxiety Score  Nervous, Anxious, on Edge 0 0 0 0  Control/stop worrying 0 0 0 0  Worry too much - different things 0 0 0 0  Trouble relaxing 0 0 0 0  Restless 0 0 0 0  Easily annoyed or irritable 0 0 0 1  Afraid - awful might happen 0 0 0 0  Total GAD 7 Score 0 0 0 1    Past Medical History:  Diagnosis Date   Allergy    Dyspnea    GERD (gastroesophageal reflux disease)    Headache    Keratosis pilaris    PCOS (polycystic ovarian syndrome)     Past Surgical History:  Procedure Laterality Date   CESAREAN SECTION     CESAREAN SECTION N/A 01/16/2021   Procedure: CESAREAN SECTION;  Surgeon: 03/02/2021, MD;  Location: [facility];  Service: Obstetrics;  Laterality: N/A;   OTHER SURGICAL HISTORY Left    cyst removed from left thumb    Family History  Problem Relation Age of Onset   Asthma Mother    Hypertension Father    Diabetes Maternal Grandfather      Social History   Tobacco Use   Smoking status: Former    Types: Cigarettes    Quit date: 2010    Years since quitting: 13.6   Smokeless tobacco: Never  Vaping Use   Vaping Use: Never used  Substance Use Topics   Alcohol use: Not Currently    Comment: occasionally   Drug use: Never    Current Outpatient Medications on File Prior to Visit  Medication Sig Dispense Refill   albuterol (VENTOLIN HFA) 108 (90 Base) MCG/ACT inhaler Inhale 2 puffs into the lungs every 6 (six) hours as needed for wheezing or shortness of breath (Cough). 18 g 0   fexofenadine (ALLEGRA) 180 MG tablet Take 1 tablet (180 mg total) by mouth daily. 90 tablet 1   fluticasone (FLONASE) 50 MCG/ACT nasal spray Place 1 spray into both nostrils daily. Begin by using 2 sprays in each nare daily for 3 to 5 days, then decrease to 1 spray in each nare daily. 32 mL 1   ibuprofen  (ADVIL) 400 MG tablet Take 1 tablet (400 mg total) by mouth every 8 (eight) hours as needed for up to 30 doses. 30 tablet 0   No current facility-administered medications on file prior to visit.     Review of Systems  Constitutional:  Positive for fatigue. Negative for fever.  HENT:  Positive for congestion (intermittent). Negative for ear pain and sore throat.   Eyes:  Positive for visual disturbance.  Respiratory: Negative.    Cardiovascular: Negative.   Gastrointestinal: Negative.   Genitourinary:  Positive for menstrual problem. Negative for dysuria and hematuria.  Musculoskeletal: Negative.   Skin: Negative.   Allergic/Immunologic: Positive for environmental allergies.  Neurological: Negative.   Psychiatric/Behavioral: Negative.        Objective:    BP 104/78   Pulse 72   Temp (!) 97.2 F (36.2 C) (Temporal)   Wt 215 lb (97.5 kg)   SpO2 96%   BMI 38.09 kg/m   Wt Readings from Last 3 Encounters:  05/07/22 215 lb (97.5 kg)  05/28/21 220 lb (99.8 kg)  04/23/21 220 lb (99.8 kg)    BP Readings from Last 3 Encounters:  05/07/22 104/78  01/18/22 (!) 159/81  04/23/21 112/70    Physical Exam Vitals and nursing note reviewed.  Constitutional:      General: She is not in acute distress.    Appearance: Normal appearance. She is obese.  HENT:     Head: Normocephalic and atraumatic.     Right Ear: Tympanic membrane, ear canal and external ear normal.     Left Ear: Tympanic membrane, ear canal and external ear normal.  Eyes:     Conjunctiva/sclera: Conjunctivae normal.  Cardiovascular:     Rate and Rhythm: Normal rate and regular rhythm.     Pulses: Normal pulses.     Heart sounds: Normal heart sounds.  Pulmonary:     Effort: Pulmonary effort is normal.     Breath sounds: Normal breath sounds.  Abdominal:     Palpations: Abdomen is soft.     Tenderness: There is no abdominal tenderness.  Musculoskeletal:        General: Normal range  of motion.     Cervical back:  Normal range of motion and neck supple. No tenderness.     Right lower leg: No edema.     Left lower leg: No edema.  Lymphadenopathy:     Cervical: No cervical adenopathy.  Skin:    General: Skin is warm and dry.  Neurological:     General: No focal deficit present.     Mental Status: She is alert and oriented to person, place, and time.     Cranial Nerves: No cranial nerve deficit.     Coordination: Coordination normal.     Gait: Gait normal.  Psychiatric:        Mood and Affect: Mood normal.        Behavior: Behavior normal.        Thought Content: Thought content normal.        Judgment: Judgment normal.        Assessment & Plan:   Problem List Items Addressed This Visit       Endocrine   PCOS (polycystic ovarian syndrome)    She has a history of PCOS and since the birth of her last child in 12/2020, she has been having irregular periods she states that her last menstrual period was about 3 months ago.  She is still breast-feeding, which may play a role in her menstrual cycles.  We will check FSH, LH, testosterone, urine pregnancy, and A1c.  Follow-up in 2 to 3 months.      Relevant Orders   Hemoglobin A1c   FSH   LH   Testosterone     Musculoskeletal and Integument   KP (keratosis pilaris)    She has a history of keratosis pilaris on her arms and legs, which she states is worsening.  She would like a referral to dermatology, referral placed today.  In the meantime, she can try triamcinolone cream twice a day for 2 weeks.      Relevant Orders   Ambulatory referral to Dermatology     Other   Seasonal allergies    Chronic, stable.  Continue Allegra as needed.  Follow-up if symptoms worsen or with any concerns.      Obesity (BMI 30-39.9)    BMI 38.  Discussed nutrition, exercise.  Goal is to lose 1 to 2 pounds per week.      Other Visit Diagnoses     Routine general medical examination at a health care facility    -  Primary   Hold maintenance reviewed and  updated.  Pap up-to-date.  Discussed nutrition, exercise.  Check CMP, CBC.  Follow-up 1 year   Relevant Orders   CBC with Differential/Platelet   Comprehensive metabolic panel   Screening, lipid       Screen lipid panel today   Relevant Orders   Lipid panel       LABORATORY TESTING:  - Pap smear: up to date  IMMUNIZATIONS:   - Tdap: Tetanus vaccination status reviewed: tetanus status unknown to the patient. - Influenza: Postponed to flu season - Pneumovax: Not applicable - Prevnar: Not applicable - HPV: Not applicable - Zostavax vaccine: Not applicable  SCREENING: -Mammogram: Not applicable  - Colonoscopy: Not applicable  - Bone Density: Not applicable  -Hearing Test: Not applicable  -Spirometry: Not applicable   PATIENT COUNSELING:   Advised to take 1 mg of folate supplement per day if capable of pregnancy.   Sexuality: Discussed sexually transmitted diseases, partner selection, use of condoms, avoidance of  unintended pregnancy  and contraceptive alternatives.   Advised to avoid cigarette smoking.  I discussed with the patient that most people either abstain from alcohol or drink within safe limits (<=14/week and <=4 drinks/occasion for males, <=7/weeks and <= 3 drinks/occasion for females) and that the risk for alcohol disorders and other health effects rises proportionally with the number of drinks per week and how often a drinker exceeds daily limits.  Discussed cessation/primary prevention of drug use and availability of treatment for abuse.   Diet: Encouraged to adjust caloric intake to maintain  or achieve ideal body weight, to reduce intake of dietary saturated fat and total fat, to limit sodium intake by avoiding high sodium foods and not adding table salt, and to maintain adequate dietary potassium and calcium preferably from fresh fruits, vegetables, and low-fat dairy products.    stressed the importance of regular exercise  Injury prevention: Discussed safety  belts, safety helmets, smoke detector, smoking near bedding or upholstery.   Dental health: Discussed importance of regular tooth brushing, flossing, and dental visits.    NEXT PREVENTATIVE PHYSICAL DUE IN 1 YEAR.  Follow up plan: Return in about 2 months (around 07/07/2022) for 2-3 months, pcos.

## 2022-05-07 NOTE — Assessment & Plan Note (Signed)
BMI 38.  Discussed nutrition, exercise.  Goal is to lose 1 to 2 pounds per week.

## 2022-05-07 NOTE — Patient Instructions (Signed)
It was great to see you!  We are checking your labs today and will let you know the results via mychart/phone.   Let's follow-up in 2-3 months, sooner if you have concerns.  If a referral was placed today, you will be contacted for an appointment. Please note that routine referrals can sometimes take up to 3-4 weeks to process. Please call our office if you haven't heard anything after this time frame.  Take care,  Rodman Pickle, NP

## 2022-05-07 NOTE — Assessment & Plan Note (Signed)
Chronic, stable.  Continue Allegra as needed.  Follow-up if symptoms worsen or with any concerns.

## 2022-05-07 NOTE — Assessment & Plan Note (Addendum)
She has a history of keratosis pilaris on her arms and legs, which she states is worsening.  She would like a referral to dermatology, referral placed today.  In the meantime, she can try triamcinolone cream twice a day for 2 weeks.

## 2022-05-08 LAB — COMPREHENSIVE METABOLIC PANEL
ALT: 16 IU/L (ref 0–32)
AST: 14 IU/L (ref 0–40)
Albumin/Globulin Ratio: 1.6 (ref 1.2–2.2)
Albumin: 4.9 g/dL (ref 3.9–4.9)
Alkaline Phosphatase: 63 IU/L (ref 44–121)
BUN/Creatinine Ratio: 10 (ref 9–23)
BUN: 7 mg/dL (ref 6–20)
Bilirubin Total: 0.5 mg/dL (ref 0.0–1.2)
CO2: 24 mmol/L (ref 20–29)
Calcium: 9.8 mg/dL (ref 8.7–10.2)
Chloride: 100 mmol/L (ref 96–106)
Creatinine, Ser: 0.67 mg/dL (ref 0.57–1.00)
Globulin, Total: 3.1 g/dL (ref 1.5–4.5)
Glucose: 79 mg/dL (ref 70–99)
Potassium: 3.9 mmol/L (ref 3.5–5.2)
Sodium: 139 mmol/L (ref 134–144)
Total Protein: 8 g/dL (ref 6.0–8.5)
eGFR: 120 mL/min/{1.73_m2} (ref >59)

## 2022-05-08 LAB — CBC WITH DIFFERENTIAL/PLATELET
Basophils Absolute: 0.1 10*3/uL (ref 0.0–0.2)
Basos: 1 %
EOS (ABSOLUTE): 0.6 10*3/uL — ABNORMAL HIGH (ref 0.0–0.4)
Eos: 6 %
Hematocrit: 41.7 % (ref 34.0–46.6)
Hemoglobin: 13.7 g/dL (ref 11.1–15.9)
Immature Grans (Abs): 0 10*3/uL (ref 0.0–0.1)
Immature Granulocytes: 0 %
Lymphocytes Absolute: 3.1 10*3/uL (ref 0.7–3.1)
Lymphs: 29 %
MCH: 27.8 pg (ref 26.6–33.0)
MCHC: 32.9 g/dL (ref 31.5–35.7)
MCV: 85 fL (ref 79–97)
Monocytes Absolute: 0.6 10*3/uL (ref 0.1–0.9)
Monocytes: 5 %
Neutrophils Absolute: 6.3 10*3/uL (ref 1.4–7.0)
Neutrophils: 59 %
Platelets: 324 10*3/uL (ref 150–450)
RBC: 4.93 x10E6/uL (ref 3.77–5.28)
RDW: 13.5 % (ref 11.7–15.4)
WBC: 10.7 10*3/uL (ref 3.4–10.8)

## 2022-05-08 LAB — HEMOGLOBIN A1C
Est. average glucose Bld gHb Est-mCnc: 126 mg/dL
Hgb A1c MFr Bld: 6 % — ABNORMAL HIGH (ref 4.8–5.6)

## 2022-05-08 LAB — LIPID PANEL
Chol/HDL Ratio: 4 ratio (ref 0.0–4.4)
Cholesterol, Total: 187 mg/dL (ref 100–199)
HDL: 47 mg/dL (ref >39)
LDL Chol Calc (NIH): 108 mg/dL — ABNORMAL HIGH (ref 0–99)
Triglycerides: 182 mg/dL — ABNORMAL HIGH (ref 0–149)
VLDL Cholesterol Cal: 32 mg/dL (ref 5–40)

## 2022-05-08 LAB — LUTEINIZING HORMONE: LH: 16.7 m[IU]/mL

## 2022-05-08 LAB — FOLLICLE STIMULATING HORMONE: FSH: 5.3 m[IU]/mL

## 2022-05-08 LAB — TESTOSTERONE: Testosterone: 40 ng/dL (ref 8–60)

## 2022-05-10 NOTE — Addendum Note (Signed)
Addended by: Rodman Pickle A on: 05/10/2022 01:03 PM   Modules accepted: Orders

## 2022-06-16 ENCOUNTER — Telehealth: Payer: Managed Care, Other (non HMO) | Admitting: Emergency Medicine

## 2022-06-16 DIAGNOSIS — J069 Acute upper respiratory infection, unspecified: Secondary | ICD-10-CM

## 2022-06-16 MED ORDER — FLUTICASONE PROPIONATE 50 MCG/ACT NA SUSP: 2.0000 | Freq: Every day | NASAL | 0 refills | Status: DC

## 2022-06-16 MED ORDER — BENZONATATE 100 MG PO CAPS: 100.0000 mg | ORAL_CAPSULE | Freq: Two times a day (BID) | ORAL | 0 refills | Status: DC | PRN

## 2022-06-16 NOTE — Progress Notes (Signed)

## 2022-07-09 ENCOUNTER — Ambulatory Visit: Payer: BC Managed Care – PPO | Admitting: Nurse Practitioner

## 2022-07-09 ENCOUNTER — Encounter: Payer: Self-pay | Admitting: Nurse Practitioner

## 2022-07-09 VITALS — BP 103/74 | HR 77 | Temp 97.8°F | Wt 209.8 lb

## 2022-07-09 DIAGNOSIS — E282 Polycystic ovarian syndrome: Secondary | ICD-10-CM

## 2022-07-09 DIAGNOSIS — L853 Xerosis cutis: Secondary | ICD-10-CM

## 2022-07-09 DIAGNOSIS — R7303 Prediabetes: Secondary | ICD-10-CM | POA: Diagnosis not present

## 2022-07-09 DIAGNOSIS — E782 Mixed hyperlipidemia: Secondary | ICD-10-CM | POA: Diagnosis not present

## 2022-07-09 DIAGNOSIS — E785 Hyperlipidemia, unspecified: Secondary | ICD-10-CM | POA: Insufficient documentation

## 2022-07-09 NOTE — Assessment & Plan Note (Signed)
Chronic, stable. She had her last menstrual period a month ago. She is still breast feeding which can affect her cycles. She was referred to GYN last visit, contact information provided to her today to call and schedule an appointment.

## 2022-07-09 NOTE — Patient Instructions (Signed)
It was great to see you!  [facility] Address: [address] [place], [place], [place] [address] Phone: [phone]  You can get a lotion with aloe and vitamin E for your face.   Let's follow-up in 4 months, sooner if you have concerns.  If a referral was placed today, you will be contacted for an appointment. Please note that routine referrals can sometimes take up to 3-4 weeks to process. Please call our office if you haven't heard anything after this time frame.  Take care,  Vance Peper, NP

## 2022-07-09 NOTE — Assessment & Plan Note (Signed)
Last A1c was 6%. Discussed nutrition and exercise. Will recheck A1c next visit. Follow-up in 4 months.

## 2022-07-09 NOTE — Progress Notes (Signed)
   Established Patient Office Visit  Subjective   Patient ID: Shawna Curtis, female    DOB: 10/28/1990  Age: 31 y.o. MRN: [id number]  Chief Complaint  Patient presents with   Follow-up    2 mo f/u PCOS.     HPI  Shawna Curtis is here to follow-up on PCOS.   She states that she got her menstrual period at the beginning of September and has not had one since. She is still breast feeding. She received a phone call from GYN, however she was unable to answer.   She also states that she used a lactic acid face wash a few days ago and her face turned red and was burning. She washed it off and the redness has calmed down, however she is asking if there is anything she can use on her face.     ROS See pertinent positives and negatives per HPI.    Objective:     BP 103/74   Pulse 77   Temp 97.8 F (36.6 C) (Oral)   Wt 209 lb 12.8 oz (95.2 kg)   SpO2 96%   BMI 37.16 kg/m    Physical Exam Vitals and nursing note reviewed.  Constitutional:      General: She is not in acute distress.    Appearance: Normal appearance.  HENT:     Head: Normocephalic.  Eyes:     Conjunctiva/sclera: Conjunctivae normal.  Cardiovascular:     Rate and Rhythm: Normal rate and regular rhythm.     Pulses: Normal pulses.     Heart sounds: Normal heart sounds.  Pulmonary:     Effort: Pulmonary effort is normal.     Breath sounds: Normal breath sounds.  Musculoskeletal:     Cervical back: Normal range of motion.  Skin:    General: Skin is warm.  Neurological:     General: No focal deficit present.     Mental Status: She is alert and oriented to person, place, and time.  Psychiatric:        Mood and Affect: Mood normal.        Behavior: Behavior normal.        Thought Content: Thought content normal.        Judgment: Judgment normal.      Assessment & Plan:   Problem List Items Addressed This Visit       Endocrine   PCOS (polycystic ovarian syndrome) - Primary     Chronic, stable. She had her last menstrual period a month ago. She is still breast feeding which can affect her cycles. She was referred to GYN last visit, contact information provided to her today to call and schedule an appointment.         Other   Prediabetes    Last A1c was 6%. Discussed nutrition and exercise. Will recheck A1c next visit. Follow-up in 4 months.       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 Discussed nutrition, exercise.       Other Visit Diagnoses     Dry skin       She can use lotion with aloe and vitamin E to help with the skin on her face. She has an appointment with Dermatology in December.        Return in about 4 months (around 11/09/2022) for prediabetes, PCOS.    Charyl Dancer, NP

## 2022-07-09 NOTE — Assessment & Plan Note (Signed)
Mixed HLD from recent labs. Discussed nutrition, exercise.

## 2022-08-31 ENCOUNTER — Ambulatory Visit
Admission: EM | Admit: 2022-08-31 | Discharge: 2022-08-31 | Disposition: A | Discharge status: Home / Self Care | Payer: BC Managed Care – PPO | Attending: Urgent Care | Admitting: Urgent Care

## 2022-08-31 DIAGNOSIS — R070 Pain in throat: Secondary | ICD-10-CM | POA: Diagnosis not present

## 2022-08-31 DIAGNOSIS — J029 Acute pharyngitis, unspecified: Secondary | ICD-10-CM | POA: Diagnosis not present

## 2022-08-31 LAB — POCT RAPID STREP A (OFFICE): Rapid Strep A Screen: NEGATIVE

## 2022-08-31 MED ORDER — AMOXICILLIN 875 MG PO TABS: 875.0000 mg | ORAL_TABLET | Freq: Two times a day (BID) | ORAL | 0 refills | Status: DC

## 2022-08-31 NOTE — ED Provider Notes (Signed)
[facility]  Note:  This document was prepared using Conservation officer, historic buildings and may include unintentional dictation errors.  MRN: [id number] DOB: [birth year]  Subjective:   Shawna Curtis is a 31 y.o. female presenting for 2 day history of throat pain, painful swallowing, occasional cough, body aches, fever (highest was 102F). No chest pain, wheezing, shob. Has a history of mild intermittent asthma, allergies. Takes Allegra as needed. No inhaler used. She is breastfeeding her infant.   No current facility-administered medications for this encounter.  Current Outpatient Medications:    albuterol (VENTOLIN HFA) 108 (90 Base) MCG/ACT inhaler, Inhale 2 puffs into the lungs every 6 (six) hours as needed for wheezing or shortness of breath (Cough)., Disp: 18 g, Rfl: 0   benzonatate (TESSALON) 100 MG capsule, Take 1 capsule (100 mg total) by mouth 2 (two) times daily as needed for cough., Disp: 20 capsule, Rfl: 0   fexofenadine (ALLEGRA) 180 MG tablet, Take 1 tablet (180 mg total) by mouth daily., Disp: 90 tablet, Rfl: 1   fluticasone (FLONASE) 50 MCG/ACT nasal spray, Place 2 sprays into both nostrils daily., Disp: 9.9 mL, Rfl: 0   ibuprofen (ADVIL) 400 MG tablet, Take 1 tablet (400 mg total) by mouth every 8 (eight) hours as needed for up to 30 doses., Disp: 30 tablet, Rfl: 0   triamcinolone cream (KENALOG) 0.1 %, Apply 1 Application topically 2 (two) times daily., Disp: 30 g, Rfl: 1   Allergies  Allergen Reactions   Shellfish Allergy Hives    Past Medical History:  Diagnosis Date   Allergy    Dyspnea    GERD (gastroesophageal reflux disease)    Headache    Keratosis pilaris    PCOS (polycystic ovarian syndrome)      Past Surgical History:  Procedure Laterality Date   CESAREAN SECTION     CESAREAN SECTION N/A 01/16/2021   Procedure: CESAREAN SECTION;  Surgeon: Kathrynn Running, MD;  Location: [facility];  Service: Obstetrics;  Laterality:  N/A;   OTHER SURGICAL HISTORY Left    cyst removed from left thumb    Family History  Problem Relation Age of Onset   Asthma Mother    Hypertension Father    Diabetes Maternal Grandfather     Social History   Tobacco Use   Smoking status: Former    Types: Cigarettes    Quit date: 2010    Years since quitting: 13.9   Smokeless tobacco: Never  Vaping Use   Vaping Use: Never used  Substance Use Topics   Alcohol use: Not Currently    Comment: occasionally   Drug use: Never    ROS   Objective:   Vitals: BP 107/74 (BP Location: Left Arm)   Pulse (!) 108   Temp 99.6 F (37.6 C) (Oral)   Resp 16   SpO2 99%   Physical Exam Constitutional:      General: She is not in acute distress.    Appearance: Normal appearance. She is well-developed and normal weight. She is not ill-appearing, toxic-appearing or diaphoretic.  HENT:     Head: Normocephalic and atraumatic.     Right Ear: Tympanic membrane, ear canal and external ear normal. No drainage or tenderness. No middle ear effusion. There is no impacted cerumen. Tympanic membrane is not erythematous or bulging.     Left Ear: Tympanic membrane, ear canal and external ear normal. No drainage or tenderness.  No middle ear effusion. There is no impacted  cerumen. Tympanic membrane is not erythematous or bulging.     Nose: Nose normal. No congestion or rhinorrhea.     Mouth/Throat:     Mouth: Mucous membranes are moist. No oral lesions.     Pharynx: Pharyngeal swelling, oropharyngeal exudate and posterior oropharyngeal erythema present. No uvula swelling.     Tonsils: Tonsillar exudate present. No tonsillar abscesses. 2+ on the left.  Eyes:     General: No scleral icterus.       Right eye: No discharge.        Left eye: No discharge.     Extraocular Movements: Extraocular movements intact.     Right eye: Normal extraocular motion.     Left eye: Normal extraocular motion.     Conjunctiva/sclera: Conjunctivae normal.   Cardiovascular:     Rate and Rhythm: Normal rate and regular rhythm.     Heart sounds: Normal heart sounds. No murmur heard.    No friction rub. No gallop.  Pulmonary:     Effort: Pulmonary effort is normal. No respiratory distress.     Breath sounds: No stridor. No wheezing, rhonchi or rales.  Chest:     Chest wall: No tenderness.  Musculoskeletal:     Cervical back: Normal range of motion and neck supple.  Lymphadenopathy:     Cervical: No cervical adenopathy.  Skin:    General: Skin is warm and dry.  Neurological:     General: No focal deficit present.     Mental Status: She is alert and oriented to person, place, and time.  Psychiatric:        Mood and Affect: Mood normal.        Behavior: Behavior normal.     Results for orders placed or performed during the hospital encounter of 08/31/22 (from the past 24 hour(s))  POCT rapid strep A     Status: None   Collection Time: 08/31/22  4:42 PM  Result Value Ref Range   Rapid Strep A Screen Negative Negative    Assessment and Plan :   PDMP not reviewed this encounter.  1. Acute pharyngitis, unspecified etiology   2. Throat pain     Will treat empirically for pharyngitis given physical exam findings.  Patient is to start amoxicillin, use supportive care otherwise. Counseled patient on potential for adverse effects with medications prescribed/recommended today, ER and return-to-clinic precautions discussed, patient verbalized understanding.    Wallis Bamberg, [place] 08/31/22 [phone]

## 2022-08-31 NOTE — ED Triage Notes (Signed)
C/O sore throat, cough and body aches. Pt reports a fever for 2 days.

## 2022-09-07 DIAGNOSIS — L738 Other specified follicular disorders: Secondary | ICD-10-CM | POA: Diagnosis not present

## 2022-09-07 DIAGNOSIS — L858 Other specified epidermal thickening: Secondary | ICD-10-CM | POA: Diagnosis not present

## 2022-09-07 DIAGNOSIS — L750 Bromhidrosis: Secondary | ICD-10-CM | POA: Diagnosis not present

## 2022-11-08 NOTE — Progress Notes (Unsigned)
   Established Patient Office Visit  Subjective   Patient ID: Shawna Curtis, female    DOB: October 20, 1990  Age: 32 y.o. MRN: [id number]  No chief complaint on file.   HPI  Shawna Curtis is here to follow-up on prediabetes and PCOS.   {History (Optional):23778}  ROS    Objective:     There were no vitals taken for this visit. {Vitals History (Optional):23777}  Physical Exam   No results found for any visits on 11/09/22.  {Labs (Optional):23779}  The ASCVD Risk score (Arnett DK, et al., 2019) failed to calculate for the following reasons:   The 2019 ASCVD risk score is only valid for ages 8 to 58    Assessment & Plan:   Problem List Items Addressed This Visit   None   No follow-ups on file.    Charyl Dancer, NP

## 2022-11-09 ENCOUNTER — Ambulatory Visit: Payer: BC Managed Care – PPO | Admitting: Nurse Practitioner

## 2022-11-09 ENCOUNTER — Encounter: Payer: Self-pay | Admitting: Nurse Practitioner

## 2022-11-09 VITALS — BP 116/80 | HR 57 | Temp 97.8°F | Ht 63.0 in | Wt 205.2 lb

## 2022-11-09 DIAGNOSIS — E282 Polycystic ovarian syndrome: Secondary | ICD-10-CM | POA: Diagnosis not present

## 2022-11-09 DIAGNOSIS — R7303 Prediabetes: Secondary | ICD-10-CM

## 2022-11-09 LAB — POCT GLYCOSYLATED HEMOGLOBIN (HGB A1C): Hemoglobin A1C: 5.7 % — AB (ref 4.0–5.6)

## 2022-11-09 NOTE — Assessment & Plan Note (Signed)
Chronic, stable. She had 1 menstrual period since her last visit.  She is still breast-feeding which can affect her cycles.  She is going to establish with GYN after she stops breast-feeding.

## 2022-11-09 NOTE — Patient Instructions (Signed)
It was great to see you!  Keep up the great work!   Add extra movement in if you can.   Let's follow-up in 6 months, sooner if you have concerns.  If a referral was placed today, you will be contacted for an appointment. Please note that routine referrals can sometimes take up to 3-4 weeks to process. Please call our office if you haven't heard anything after this time frame.  Take care,  Vance Peper, NP

## 2022-11-09 NOTE — Assessment & Plan Note (Signed)
A1c has improved to 5.7%.  Congratulated her on her nutrition changes and limiting her sugar.  She can try to get more steps and activity during her day, even if it is not going to the gym.  Follow-up in 6 months.

## 2023-01-10 ENCOUNTER — Encounter: Payer: Self-pay | Admitting: *Deleted

## 2023-02-16 DIAGNOSIS — J069 Acute upper respiratory infection, unspecified: Secondary | ICD-10-CM | POA: Diagnosis not present

## 2023-02-16 DIAGNOSIS — R062 Wheezing: Secondary | ICD-10-CM | POA: Diagnosis not present

## 2023-02-17 ENCOUNTER — Observation Stay (HOSPITAL_COMMUNITY)
Admission: EM | Admit: 2023-02-17 | Discharge: 2023-02-18 | Disposition: A | Discharge status: Home / Self Care | Payer: BC Managed Care – PPO | Attending: Family Medicine | Admitting: Family Medicine

## 2023-02-17 ENCOUNTER — Other Ambulatory Visit: Payer: Self-pay

## 2023-02-17 ENCOUNTER — Emergency Department (HOSPITAL_COMMUNITY): Payer: BC Managed Care – PPO

## 2023-02-17 DIAGNOSIS — R0902 Hypoxemia: Secondary | ICD-10-CM | POA: Diagnosis not present

## 2023-02-17 DIAGNOSIS — J209 Acute bronchitis, unspecified: Secondary | ICD-10-CM | POA: Diagnosis not present

## 2023-02-17 DIAGNOSIS — R6510 Systemic inflammatory response syndrome (SIRS) of non-infectious origin without acute organ dysfunction: Secondary | ICD-10-CM | POA: Insufficient documentation

## 2023-02-17 DIAGNOSIS — E872 Acidosis, unspecified: Secondary | ICD-10-CM | POA: Insufficient documentation

## 2023-02-17 DIAGNOSIS — R739 Hyperglycemia, unspecified: Secondary | ICD-10-CM | POA: Insufficient documentation

## 2023-02-17 DIAGNOSIS — J206 Acute bronchitis due to rhinovirus: Principal | ICD-10-CM | POA: Insufficient documentation

## 2023-02-17 DIAGNOSIS — Z1152 Encounter for screening for COVID-19: Secondary | ICD-10-CM | POA: Diagnosis not present

## 2023-02-17 DIAGNOSIS — Z79899 Other long term (current) drug therapy: Secondary | ICD-10-CM | POA: Insufficient documentation

## 2023-02-17 DIAGNOSIS — E876 Hypokalemia: Secondary | ICD-10-CM | POA: Diagnosis not present

## 2023-02-17 DIAGNOSIS — Z87891 Personal history of nicotine dependence: Secondary | ICD-10-CM | POA: Diagnosis not present

## 2023-02-17 DIAGNOSIS — R0602 Shortness of breath: Secondary | ICD-10-CM | POA: Diagnosis not present

## 2023-02-17 LAB — CBC WITH DIFFERENTIAL/PLATELET
Abs Immature Granulocytes: 0.09 10*3/uL — ABNORMAL HIGH (ref 0.00–0.07)
Basophils Absolute: 0 10*3/uL (ref 0.0–0.1)
Basophils Relative: 0 %
Eosinophils Absolute: 0 10*3/uL (ref 0.0–0.5)
Eosinophils Relative: 0 %
HCT: 41.1 % (ref 36.0–46.0)
Hemoglobin: 12.9 g/dL (ref 12.0–15.0)
Immature Granulocytes: 1 %
Lymphocytes Relative: 10 %
Lymphs Abs: 1.3 10*3/uL (ref 0.7–4.0)
MCH: 27.2 pg (ref 26.0–34.0)
MCHC: 31.4 g/dL (ref 30.0–36.0)
MCV: 86.7 fL (ref 80.0–100.0)
Monocytes Absolute: 0.1 10*3/uL (ref 0.1–1.0)
Monocytes Relative: 1 %
Neutro Abs: 12 10*3/uL — ABNORMAL HIGH (ref 1.7–7.7)
Neutrophils Relative %: 88 %
Platelets: 279 10*3/uL (ref 150–400)
RBC: 4.74 MIL/uL (ref 3.87–5.11)
RDW: 13.5 % (ref 11.5–15.5)
WBC: 13.5 10*3/uL — ABNORMAL HIGH (ref 4.0–10.5)
nRBC: 0 % (ref 0.0–0.2)

## 2023-02-17 LAB — COMPREHENSIVE METABOLIC PANEL
ALT: 10 U/L (ref 0–44)
AST: 26 U/L (ref 15–41)
Albumin: 3.7 g/dL (ref 3.5–5.0)
Alkaline Phosphatase: 49 U/L (ref 38–126)
Anion gap: 14 (ref 5–15)
BUN: 15 mg/dL (ref 6–20)
CO2: 18 mmol/L — ABNORMAL LOW (ref 22–32)
Calcium: 9 mg/dL (ref 8.9–10.3)
Chloride: 105 mmol/L (ref 98–111)
Creatinine, Ser: 0.8 mg/dL (ref 0.44–1.00)
GFR, Estimated: >60 mL/min (ref >60)
Glucose, Bld: 215 mg/dL — ABNORMAL HIGH (ref 70–99)
Potassium: 2.9 mmol/L — ABNORMAL LOW (ref 3.5–5.1)
Sodium: 137 mmol/L (ref 135–145)
Total Bilirubin: 0.7 mg/dL (ref 0.3–1.2)
Total Protein: 7.7 g/dL (ref 6.5–8.1)

## 2023-02-17 LAB — LACTIC ACID, PLASMA: Lactic Acid, Venous: 4.8 mmol/L (ref 0.5–1.9)

## 2023-02-17 LAB — TROPONIN I (HIGH SENSITIVITY): Troponin I (High Sensitivity): 2 ng/L (ref <18)

## 2023-02-17 LAB — POC URINE PREG, ED: Preg Test, Ur: NEGATIVE

## 2023-02-17 LAB — SARS CORONAVIRUS 2 BY RT PCR: SARS Coronavirus 2 by RT PCR: NEGATIVE

## 2023-02-17 MED ORDER — ALBUTEROL SULFATE HFA 108 (90 BASE) MCG/ACT IN AERS: 2.0000 | INHALATION_SPRAY | RESPIRATORY_TRACT | Status: DC | PRN

## 2023-02-17 MED ORDER — SODIUM CHLORIDE 0.9 % IV SOLN
500.0000 mg | Freq: Once | INTRAVENOUS | Status: AC
  Administered 2023-02-18: 500 mg via INTRAVENOUS
  Filled 2023-02-17: qty 5

## 2023-02-17 MED ORDER — IPRATROPIUM-ALBUTEROL 0.5-2.5 (3) MG/3ML IN SOLN
3.0000 mL | Freq: Once | RESPIRATORY_TRACT | Status: AC
  Administered 2023-02-17: 3 mL via RESPIRATORY_TRACT
  Filled 2023-02-17: qty 3

## 2023-02-17 MED ORDER — SODIUM CHLORIDE 0.9 % IV BOLUS
1000.0000 mL | Freq: Once | INTRAVENOUS | Status: AC
  Administered 2023-02-17: 1000 mL via INTRAVENOUS

## 2023-02-17 MED ORDER — SODIUM CHLORIDE 0.9 % IV SOLN
1.0000 g | Freq: Once | INTRAVENOUS | Status: AC
  Administered 2023-02-18: 1 g via INTRAVENOUS
  Filled 2023-02-17: qty 10

## 2023-02-17 MED ORDER — LACTATED RINGERS IV BOLUS: 1000.0000 mL | Freq: Once | INTRAVENOUS | Status: DC

## 2023-02-17 MED ORDER — ALBUTEROL SULFATE (2.5 MG/3ML) 0.083% IN NEBU
20.0000 mg/h | INHALATION_SOLUTION | Freq: Once | RESPIRATORY_TRACT | Status: AC
  Administered 2023-02-17: 20 mg/h via RESPIRATORY_TRACT
  Filled 2023-02-17: qty 3

## 2023-02-17 MED ORDER — LACTATED RINGERS IV BOLUS
30.0000 mL/kg | Freq: Once | INTRAVENOUS | Status: AC
  Administered 2023-02-18: 2790 mL via INTRAVENOUS

## 2023-02-17 MED ORDER — MAGNESIUM SULFATE 2 GM/50ML IV SOLN
2.0000 g | Freq: Once | INTRAVENOUS | Status: AC
  Administered 2023-02-17: 2 g via INTRAVENOUS
  Filled 2023-02-17: qty 50

## 2023-02-17 MED ORDER — METHYLPREDNISOLONE SODIUM SUCC 125 MG IJ SOLR
125.0000 mg | Freq: Once | INTRAMUSCULAR | Status: AC
  Administered 2023-02-17: 125 mg via INTRAVENOUS
  Filled 2023-02-17: qty 2

## 2023-02-17 NOTE — ED Provider Notes (Addendum)
I provided a substantive portion of the care of this patient.  I personally made/approved the management plan for this patient and take responsibility for the patient management.  EKG Interpretation  Date/Time:  Thursday Feb 17 2023 14:36:50 EDT Ventricular Rate:  97 PR Interval:  150 QRS Duration: 70 QT Interval:  330 QTC Calculation: 419 R Axis:   38 Text Interpretation: Normal sinus rhythm with sinus arrhythmia Normal ECG No previous ECGs available agree Confirmed by Arby Barrette [phone]) on 02/17/2023 9:30:48 PM   Patient had a cough for 3 days.  She reports she has become increasingly short of breath and has a lot of wheezing and productive cough.  She denies she has had similar symptoms previously.  Patient is alert.  She is seen after she had her first DuoNeb when she has continuous neb going.  She has mild increased work of breathing.  Coarse wheeze throughout lung fields more concentrated at bases.  Occasional crackle right lung base.  Abdomen soft nontender.  No peripheral edema.  Patient denies any smoking.  No tobacco, no marijuana, no vapes.  She does not work in a Phot[employer].  She has had sick contact via her kids.  She has had treatment at this point with Solu-Medrol, magnesium and DuoNeb's now on I continuous nebulizer.  Patient does not have history of significant or severe asthma.  Chest x-ray does not show focal consolidation.  Will proceed with CT PE study.  Of concern for possible PE with hypoxia, tachypnea and tachycardia.  Also possible diffuse pulmonary process.  Will obtain CT for additional evaluation of PE versus pneumonia versus other inflammatory\autoimmune process.  Lactic acid returns 4.5.  WBC 13.  Patient is tachycardic.  She had mild hypoxia at 93 to 95% with extensive wheezing.  She also reports productive cough.  At this time meeting sepsis criteria with white count lactic acidosis tachycardia and source.  Will initiate antibiotics for  community-acquired pneumonia with Zithromax and Rocephin.  After continuous nebulizer therapy.  Patient does have improved airflow.  She continues to have expiratory wheeze but is much more comfortable in appearance and at this time oxygen saturations are staying to her mid to upper 90s.  Will plan for admission with suspected community-acquired pneumonia.  Patient will need ongoing nebulizer therapy and steroid treatment.  CT scan still pending.  Patient updated on plan and agreeable.  Consult: [facility] hospitalist Dr. Toniann Fail to admit.   CRITICAL CARE Performed by: Arby Barrette   Total critical care time: 30 minutes  Critical care time was exclusive of separately billable procedures and treating other patients.  Critical care was necessary to treat or prevent imminent or life-threatening deterioration.  Critical care was time spent personally by me on the following activities: development of treatment plan with patient and/or surrogate as well as nursing, discussions with consultants, evaluation of patient's response to treatment, examination of patient, obtaining history from patient or surrogate, ordering and performing treatments and interventions, ordering and review of laboratory studies, ordering and review of radiographic studies, pulse oximetry and re-evaluation of patient's condition.  Arby Barrette, MD 02/17/23 6045    Arby Barrette, MD 02/18/23 4098    Arby Barrette, MD 02/18/23 [phone]

## 2023-02-17 NOTE — ED Notes (Signed)
Notified Dr. Donnald Garre of patient's Lactic Acid 4.8, Dr. Donnald Garre to enter new orders.

## 2023-02-17 NOTE — ED Triage Notes (Signed)
Pt reports that two of her children have recently been sick and approximately five days ago she began having URI symptoms. Pt reports that since yesterday she has been having increasing SOB. Seen at [facility] and given prednisone course and albuterol inhaler without relief. Pt denies pain currently.

## 2023-02-17 NOTE — ED Notes (Signed)
Pt presents with SOB, current treatment at home is not effective for pt. NAD noted at this time.

## 2023-02-17 NOTE — ED Notes (Signed)
Rt called for Respiratory spacer, RT will bring to the patient's room

## 2023-02-17 NOTE — ED Provider Notes (Signed)
[place] EMERGENCY DEPARTMENT AT [facility] Provider Note   CSN: [id number] Arrival date & time: 02/17/23  1438     History  Chief Complaint  Patient presents with   Shortness of Breath    Shawna Curtis is a 32 y.o. female, history of asthma, who presents to the ED secondary to shortness of breath, cough, runny nose x 5 days.  She states that all of her children, were sick, but she developed a cold as well, and has just been short of breath.  She states that her chest feels little bit tight, and she cannot get a deep breath and has been hearing audible wheezes.  Went to urgent care yesterday, and had some albuterol given to her as well as steroids, started that without any relief.     Home Medications Prior to Admission medications   Medication Sig Start Date End Date Taking? Authorizing Provider  albuterol (VENTOLIN HFA) 108 (90 Base) MCG/ACT inhaler Inhale 2 puffs into the lungs every 6 (six) hours as needed for wheezing or shortness of breath (Cough). Patient not taking: Reported on 11/09/2022 01/18/22   Theadora Rama Scales, PA-C  fexofenadine (ALLEGRA) 180 MG tablet Take 1 tablet (180 mg total) by mouth daily. 01/18/22 11/09/22  Theadora Rama Scales, PA-C  ibuprofen (ADVIL) 400 MG tablet Take 1 tablet (400 mg total) by mouth every 8 (eight) hours as needed for up to 30 doses. 01/18/22   Theadora Rama Scales, PA-C      Allergies    Shellfish allergy    Review of Systems   Review of Systems  Constitutional:  Negative for fever.  Respiratory:  Positive for chest tightness and shortness of breath.     Physical Exam Updated Vital Signs BP (!) 146/135 (BP Location: Left Arm)   Pulse 95   Temp 98 F (36.7 C) (Oral)   Resp (!) 21   SpO2 95%  Physical Exam Vitals and nursing note reviewed.  Constitutional:      General: She is not in acute distress.    Appearance: She is well-developed.  HENT:     Head: Normocephalic and atraumatic.  Eyes:      Conjunctiva/sclera: Conjunctivae normal.  Cardiovascular:     Rate and Rhythm: Normal rate and regular rhythm.     Heart sounds: No murmur heard. Pulmonary:     Effort: Pulmonary effort is normal. No respiratory distress.     Breath sounds: Examination of the right-upper field reveals wheezing and rhonchi. Examination of the left-upper field reveals wheezing and rhonchi. Examination of the right-lower field reveals wheezing and rhonchi. Examination of the left-lower field reveals wheezing and rhonchi. Wheezing and rhonchi present.  Abdominal:     Palpations: Abdomen is soft.     Tenderness: There is no abdominal tenderness.  Musculoskeletal:        General: No swelling.     Cervical back: Neck supple.  Skin:    General: Skin is warm and dry.     Capillary Refill: Capillary refill takes less than 2 seconds.  Neurological:     Mental Status: She is alert.  Psychiatric:        Mood and Affect: Mood normal.     ED Results / Procedures / Treatments   Labs (all labs ordered are listed, but only abnormal results are displayed) Labs Reviewed  SARS CORONAVIRUS 2 BY RT PCR    EKG None  Radiology DG Chest 2 View  Result Date: 02/17/2023 CLINICAL DATA:  Short of breath EXAM: CHEST - 2 VIEW COMPARISON:  None Available. FINDINGS: The heart size and mediastinal contours are within normal limits. Both lungs are clear. The visualized skeletal structures are unremarkable. IMPRESSION: No active cardiopulmonary disease. Electronically Signed   By: Sharlet Salina M.D.   On: 02/17/2023 15:19    Procedures Procedures    Medications Ordered in ED Medications  albuterol (VENTOLIN HFA) 108 (90 Base) MCG/ACT inhaler 2 puff (has no administration in time range)    ED Course/ Medical Decision Making/ A&P                             Medical Decision Making Patient is a 32 year old female, history of familial asthma, who presents to the ED secondary to shortness of breath, cough for the last 5  days, her family members are sick as well.  She is very diffusely wheezy, with rhonchi, so I believe that she may be an asthma exacerbation, we will start with a DuoNeb, magnesium, Solu-Medrol, IV fluids for further evaluation.  Amount and/or Complexity of Data Reviewed Labs: ordered.    Details: COVID negative Radiology: ordered.    Details: Chest xray clear Discussion of management or test interpretation with external provider(s): No improvement, after medications, we will do continuous albuterol neb, discussed with Dr. Donnald Garre, she evaluated patient, recommends CTA PE study, as well as troponin, labs.  She resumed care of this patient, she will further follow-up on patient and disposition appropriately.  Risk Prescription drug management.    Final Clinical Impression(s) / ED Diagnoses Final diagnoses:  None    Rx / DC Orders ED Discharge Orders     None         Andrus Sharp, Harley [place], PA 02/17/23 2156    Arby Barrette, MD 02/18/23 [phone]

## 2023-02-17 NOTE — ED Notes (Signed)
Dr. Donnald Garre ordered to collect repeat lactic acid after fluid bolus

## 2023-02-18 ENCOUNTER — Observation Stay (HOSPITAL_COMMUNITY): Payer: BC Managed Care – PPO

## 2023-02-18 ENCOUNTER — Encounter (HOSPITAL_COMMUNITY): Payer: Self-pay | Admitting: Internal Medicine

## 2023-02-18 ENCOUNTER — Other Ambulatory Visit: Payer: Self-pay

## 2023-02-18 DIAGNOSIS — J209 Acute bronchitis, unspecified: Secondary | ICD-10-CM

## 2023-02-18 DIAGNOSIS — J219 Acute bronchiolitis, unspecified: Secondary | ICD-10-CM

## 2023-02-18 DIAGNOSIS — E876 Hypokalemia: Secondary | ICD-10-CM | POA: Insufficient documentation

## 2023-02-18 DIAGNOSIS — R0602 Shortness of breath: Secondary | ICD-10-CM | POA: Diagnosis not present

## 2023-02-18 DIAGNOSIS — E872 Acidosis, unspecified: Secondary | ICD-10-CM

## 2023-02-18 DIAGNOSIS — R739 Hyperglycemia, unspecified: Secondary | ICD-10-CM | POA: Insufficient documentation

## 2023-02-18 DIAGNOSIS — J206 Acute bronchitis due to rhinovirus: Secondary | ICD-10-CM | POA: Insufficient documentation

## 2023-02-18 DIAGNOSIS — R6510 Systemic inflammatory response syndrome (SIRS) of non-infectious origin without acute organ dysfunction: Secondary | ICD-10-CM | POA: Insufficient documentation

## 2023-02-18 LAB — BASIC METABOLIC PANEL
Anion gap: 9 (ref 5–15)
BUN: 6 mg/dL (ref 6–20)
CO2: 17 mmol/L — ABNORMAL LOW (ref 22–32)
Calcium: 8.2 mg/dL — ABNORMAL LOW (ref 8.9–10.3)
Chloride: 112 mmol/L — ABNORMAL HIGH (ref 98–111)
Creatinine, Ser: 0.66 mg/dL (ref 0.44–1.00)
GFR, Estimated: >60 mL/min (ref >60)
Glucose, Bld: 156 mg/dL — ABNORMAL HIGH (ref 70–99)
Potassium: 4.2 mmol/L (ref 3.5–5.1)
Sodium: 138 mmol/L (ref 135–145)

## 2023-02-18 LAB — CBC
HCT: 35 % — ABNORMAL LOW (ref 36.0–46.0)
Hemoglobin: 11.4 g/dL — ABNORMAL LOW (ref 12.0–15.0)
MCH: 28.4 pg (ref 26.0–34.0)
MCHC: 32.6 g/dL (ref 30.0–36.0)
MCV: 87.3 fL (ref 80.0–100.0)
Platelets: 265 10*3/uL (ref 150–400)
RBC: 4.01 MIL/uL (ref 3.87–5.11)
RDW: 13.7 % (ref 11.5–15.5)
WBC: 13.9 10*3/uL — ABNORMAL HIGH (ref 4.0–10.5)
nRBC: 0 % (ref 0.0–0.2)

## 2023-02-18 LAB — RESPIRATORY PANEL BY PCR

## 2023-02-18 LAB — I-STAT ARTERIAL BLOOD GAS, ED
Acid-base deficit: 10 mmol/L — ABNORMAL HIGH (ref 0.0–2.0)
Bicarbonate: 14.8 mmol/L — ABNORMAL LOW (ref 20.0–28.0)
Calcium, Ion: 1.17 mmol/L (ref 1.15–1.40)
HCT: 35 % — ABNORMAL LOW (ref 36.0–46.0)
Hemoglobin: 11.9 g/dL — ABNORMAL LOW (ref 12.0–15.0)
O2 Saturation: 93 %
Patient temperature: 98.7
Potassium: 4 mmol/L (ref 3.5–5.1)
Sodium: 142 mmol/L (ref 135–145)
TCO2: 16 mmol/L — ABNORMAL LOW (ref 22–32)
pCO2 arterial: 29.5 mmHg — ABNORMAL LOW (ref 32–48)
pH, Arterial: 7.308 — ABNORMAL LOW (ref 7.35–7.45)
pO2, Arterial: 74 mmHg — ABNORMAL LOW (ref 83–108)

## 2023-02-18 LAB — TROPONIN I (HIGH SENSITIVITY)
Troponin I (High Sensitivity): 20 ng/L — ABNORMAL HIGH (ref <18)
Troponin I (High Sensitivity): 21 ng/L — ABNORMAL HIGH (ref <18)
Troponin I (High Sensitivity): 15 ng/L (ref <18)

## 2023-02-18 LAB — MAGNESIUM: Magnesium: 2.2 mg/dL (ref 1.7–2.4)

## 2023-02-18 LAB — LACTIC ACID, PLASMA
Lactic Acid, Venous: 6.4 mmol/L (ref 0.5–1.9)
Lactic Acid, Venous: 2.9 mmol/L (ref 0.5–1.9)
Lactic Acid, Venous: 2.8 mmol/L (ref 0.5–1.9)
Lactic Acid, Venous: 2.5 mmol/L (ref 0.5–1.9)

## 2023-02-18 LAB — CBG MONITORING, ED: Glucose-Capillary: 153 mg/dL — ABNORMAL HIGH (ref 70–99)

## 2023-02-18 LAB — HIV ANTIBODY (ROUTINE TESTING W REFLEX): HIV Screen 4th Generation wRfx: NONREACTIVE

## 2023-02-18 MED ORDER — ACETAMINOPHEN 325 MG PO TABS: 650.0000 mg | ORAL_TABLET | Freq: Four times a day (QID) | ORAL | Status: DC | PRN

## 2023-02-18 MED ORDER — BUDESONIDE 0.25 MG/2ML IN SUSP
0.2500 mg | Freq: Two times a day (BID) | RESPIRATORY_TRACT | Status: DC
  Administered 2023-02-18: 0.25 mg via RESPIRATORY_TRACT
  Filled 2023-02-18: qty 2

## 2023-02-18 MED ORDER — ALBUTEROL SULFATE (2.5 MG/3ML) 0.083% IN NEBU: 2.5000 mg | INHALATION_SOLUTION | Freq: Four times a day (QID) | RESPIRATORY_TRACT | Status: DC

## 2023-02-18 MED ORDER — SODIUM CHLORIDE 0.9 % IV BOLUS
1000.0000 mL | Freq: Once | INTRAVENOUS | Status: AC
  Administered 2023-02-18: 1000 mL via INTRAVENOUS

## 2023-02-18 MED ORDER — ACETAMINOPHEN 650 MG RE SUPP: 650.0000 mg | Freq: Four times a day (QID) | RECTAL | Status: DC | PRN

## 2023-02-18 MED ORDER — POTASSIUM CHLORIDE 20 MEQ PO PACK
40.0000 meq | PACK | Freq: Once | ORAL | Status: AC
  Administered 2023-02-18: 40 meq via ORAL
  Filled 2023-02-18: qty 2

## 2023-02-18 MED ORDER — ALBUTEROL SULFATE (2.5 MG/3ML) 0.083% IN NEBU
2.5000 mg | INHALATION_SOLUTION | RESPIRATORY_TRACT | Status: DC
  Administered 2023-02-18: 2.5 mg via RESPIRATORY_TRACT
  Filled 2023-02-18: qty 3

## 2023-02-18 MED ORDER — PREDNISONE 50 MG PO TABS: 50.0000 mg | ORAL_TABLET | Freq: Every day | ORAL | 0 refills | Status: AC

## 2023-02-18 MED ORDER — IOHEXOL 350 MG/ML SOLN
75.0000 mL | Freq: Once | INTRAVENOUS | Status: AC | PRN
  Administered 2023-02-18: 75 mL via INTRAVENOUS

## 2023-02-18 MED ORDER — POTASSIUM CHLORIDE 10 MEQ/100ML IV SOLN
10.0000 meq | INTRAVENOUS | Status: AC
  Administered 2023-02-18 (×3): 10 meq via INTRAVENOUS
  Filled 2023-02-18 (×3): qty 100

## 2023-02-18 MED ORDER — ALBUTEROL SULFATE (2.5 MG/3ML) 0.083% IN NEBU
2.5000 mg | INHALATION_SOLUTION | RESPIRATORY_TRACT | Status: DC | PRN
  Administered 2023-02-18: 2.5 mg via RESPIRATORY_TRACT
  Filled 2023-02-18: qty 3

## 2023-02-18 MED ORDER — ENOXAPARIN SODIUM 40 MG/0.4ML IJ SOSY: 40.0000 mg | PREFILLED_SYRINGE | INTRAMUSCULAR | Status: DC

## 2023-02-18 MED ORDER — ALBUTEROL SULFATE (2.5 MG/3ML) 0.083% IN NEBU: 2.5000 mg | INHALATION_SOLUTION | RESPIRATORY_TRACT | 0 refills | Status: AC | PRN

## 2023-02-18 MED ORDER — SODIUM CHLORIDE 0.9 % IV SOLN: INTRAVENOUS | Status: DC

## 2023-02-18 MED ORDER — METHYLPREDNISOLONE SODIUM SUCC 125 MG IJ SOLR
80.0000 mg | INTRAMUSCULAR | Status: DC
  Administered 2023-02-18: 80 mg via INTRAVENOUS
  Filled 2023-02-18: qty 2

## 2023-02-18 NOTE — ED Notes (Signed)
Labs not collected RN WILL DRAW@4 :52AM

## 2023-02-18 NOTE — H&P (Signed)
History and Physical    Shawna Curtis ZOX:[id number] DOB: [birth year] DOA: 02/17/2023  Patient coming from: Home.  Chief Complaint: Shortness of breath.  HPI: Shawna Curtis is a 32 y.o. female with no significant past medical history presents to the ER with complaints of shortness of breath and wheezing.  Patient states her symptoms started about 5 days ago.  Has been having productive cough.  Denies any fever chills or chest pain.  Patient states her children has been recently sick.  Patient had gone to the urgent care 2 days ago and was prescribed albuterol prednisone despite taking which her symptoms did not improve.  ED Course: In the ER patient was found to be wheezing.  Tachycardic.  Labs show mild leukocytosis and lactic acidosis.  Patient was given nebulizer therapy and since there was some concern for possible sepsis given the lactic acidosis and leukocytosis empirically started on antibiotics.  Was given fluid bolus.  CT angiogram of chest was negative for PE and did not show anything acute.  COVID test was negative yesterday.  Review of Systems: As per HPI, rest all negative.   Past Medical History:  Diagnosis Date   Allergy    Dyspnea    GERD (gastroesophageal reflux disease)    Headache    Keratosis pilaris    PCOS (polycystic ovarian syndrome)     Past Surgical History:  Procedure Laterality Date   CESAREAN SECTION     CESAREAN SECTION N/A 01/16/2021   Procedure: CESAREAN SECTION;  Surgeon: Kathrynn Running, MD;  Location: [facility] LD ORS;  Service: Obstetrics;  Laterality: N/A;   OTHER SURGICAL HISTORY Left    cyst removed from left thumb     reports that she quit smoking about 14 years ago. Her smoking use included cigarettes. She has never used smokeless tobacco. She reports that she does not currently use alcohol. She reports that she does not use drugs.  Allergies  Allergen Reactions   Shellfish Allergy Hives    Family History  Problem  Relation Age of Onset   Asthma Mother    Hypertension Father    Diabetes Maternal Grandfather     Prior to Admission medications   Medication Sig Start Date End Date Taking? Authorizing Provider  albuterol (VENTOLIN HFA) 108 (90 Base) MCG/ACT inhaler Inhale 2 puffs into the lungs every 6 (six) hours as needed for wheezing or shortness of breath (Cough). 01/18/22  Yes Theadora Rama Scales, PA-C  predniSONE (DELTASONE) 10 MG tablet Take 10 mg by mouth See admin instructions. Take 6 pills by mouth on day 1, 5 pills by mouth on day 2, 4 pills by mouth on day 3, 3 pills by mouth on day for, 2 pills by mouth on  day 5, 1 pills by mouth on day 6. Take all pills first thing in a.m. with food 02/16/23 02/26/23 Yes [provider]  fexofenadine (ALLEGRA) 180 MG tablet Take 1 tablet (180 mg total) by mouth daily. 01/18/22 11/09/22  Theadora Rama Scales, PA-C  ibuprofen (ADVIL) 400 MG tablet Take 1 tablet (400 mg total) by mouth every 8 (eight) hours as needed for up to 30 doses. Patient not taking: Reported on 02/17/2023 01/18/22   Theadora Rama Scales, PA-C    Physical Exam: Constitutional: Moderately built and nourished. Vitals:   02/18/23 0008 02/18/23 0100 02/18/23 0130 02/18/23 0300  BP: (!) 109/58 (!) 117/55 105/65   Pulse: (!) 129 (!) 118 (!) 119   Resp: 20 20 (!) 25  Temp: 98.3 F (36.8 C)   97.9 F (36.6 C)  TempSrc: Oral   Oral  SpO2: 98% 95% 95%    Eyes: Anicteric no pallor. ENMT: No discharge from the ears eyes nose or mouth. Neck: No mass felt.  No JVD appreciated. Respiratory: Bilateral expiratory wheeze heard. Cardiovascular: S1-S2 heard. Abdomen: Soft nontender bowel sound present. Musculoskeletal: No edema. Skin: No rash. Neurologic: Alert awake oriented time place and person.  Moves all extremities. Psychiatric: Appears normal.  Normal affect.   Labs on Admission: I have personally reviewed following labs and imaging studies  CBC: Recent Labs  Lab  02/17/23 2235 02/18/23 0316  WBC 13.5*  --   NEUTROABS 12.0*  --   HGB 12.9 11.9*  HCT 41.1 35.0*  MCV 86.7  --   PLT 279  --    Basic Metabolic Panel: Recent Labs  Lab 02/17/23 2235 02/18/23 0316  NA 137 142  K 2.9* 4.0  CL 105  --   CO2 18*  --   GLUCOSE 215*  --   BUN 15  --   CREATININE 0.80  --   CALCIUM 9.0  --    GFR: CrCl cannot be calculated (Unknown ideal weight.). Liver Function Tests: Recent Labs  Lab 02/17/23 2235  AST 26  ALT 10  ALKPHOS 49  BILITOT 0.7  PROT 7.7  ALBUMIN 3.7   No results for input(s): "LIPASE", "AMYLASE" in the last 168 hours. No results for input(s): "AMMONIA" in the last 168 hours. Coagulation Profile: No results for input(s): "INR", "PROTIME" in the last 168 hours. Cardiac Enzymes: No results for input(s): "CKTOTAL", "CKMB", "CKMBINDEX", "TROPONINI" in the last 168 hours. BNP (last 3 results) No results for input(s): "PROBNP" in the last 8760 hours. HbA1C: No results for input(s): "HGBA1C" in the last 72 hours. CBG: Recent Labs  Lab 02/18/23 0349  GLUCAP 153*   Lipid Profile: No results for input(s): "CHOL", "HDL", "LDLCALC", "TRIG", "CHOLHDL", "LDLDIRECT" in the last 72 hours. Thyroid Function Tests: No results for input(s): "TSH", "T4TOTAL", "FREET4", "T3FREE", "THYROIDAB" in the last 72 hours. Anemia Panel: No results for input(s): "VITAMINB12", "FOLATE", "FERRITIN", "TIBC", "IRON", "RETICCTPCT" in the last 72 hours. Urine analysis:    Component Value Date/Time   COLORURINE YELLOW 01/16/2021 1050   APPEARANCEUR HAZY (A) 01/16/2021 1050   LABSPEC 1.015 01/16/2021 1050   PHURINE 6.0 01/16/2021 1050   GLUCOSEU NEGATIVE 01/16/2021 1050   HGBUR LARGE (A) 01/16/2021 1050   BILIRUBINUR NEGATIVE 01/16/2021 1050   KETONESUR NEGATIVE 01/16/2021 1050   PROTEINUR NEGATIVE 01/16/2021 1050   UROBILINOGEN 0.2 10/28/2020 1302   NITRITE NEGATIVE 01/16/2021 1050   LEUKOCYTESUR SMALL (A) 01/16/2021 1050   Sepsis  Labs: @LABRCNTIP (procalcitonin:4,lacticidven:4) ) Recent Results (from the past 240 hour(s))  SARS Coronavirus 2 by RT PCR (hospital order, performed in [facility] hospital lab) *cepheid single result test* Anterior Nasal Swab     Status: None   Collection Time: 02/17/23  3:01 PM   Specimen: Anterior Nasal Swab  Result Value Ref Range Status   SARS Coronavirus 2 by RT PCR NEGATIVE NEGATIVE Final    Comment: Performed at [facility], [address], [place], [place] [address]     Radiological Exams on Admission: CT Angio Chest PE W and/or Wo Contrast  Result Date: 02/18/2023 CLINICAL DATA:  Shortness of breath. EXAM: CT ANGIOGRAPHY CHEST WITH CONTRAST TECHNIQUE: Multidetector CT imaging of the chest was performed using the standard protocol during bolus administration of intravenous contrast. Multiplanar  CT image reconstructions and MIPs were obtained to evaluate the vascular anatomy. RADIATION DOSE REDUCTION: This exam was performed according to the departmental dose-optimization program which includes automated exposure control, adjustment of the mA and/or kV according to patient size and/or use of iterative reconstruction technique. CONTRAST:  75mL OMNIPAQUE IOHEXOL 350 MG/ML SOLN COMPARISON:  Chest radiograph dated 02/17/2023. FINDINGS: Cardiovascular: There is no cardiomegaly or pericardial effusion. The thoracic aorta is unremarkable. The origins of the great vessels of the arch appear patent. Evaluation of the pulmonary arteries is limited due to respiratory motion and suboptimal opacification and timing of the contrast. No pulmonary artery embolus identified. Mediastinum/Nodes: No hilar or mediastinal adenopathy. The esophagus and the thyroid gland are grossly unremarkable. No mediastinal fluid collection. Lungs/Pleura: No focal consolidation, pleural effusion, or pneumothorax. The central airways are patent. Upper Abdomen: No acute abnormality. Musculoskeletal: No acute osseous  pathology. Review of the MIP images confirms the above findings. IMPRESSION: No acute intrathoracic pathology. No CT evidence of pulmonary artery embolus. Electronically Signed   By: Elgie Collard M.D.   On: 02/18/2023 02:16   DG Chest 2 View  Result Date: 02/17/2023 CLINICAL DATA:  Short of breath EXAM: CHEST - 2 VIEW COMPARISON:  None Available. FINDINGS: The heart size and mediastinal contours are within normal limits. Both lungs are clear. The visualized skeletal structures are unremarkable. IMPRESSION: No active cardiopulmonary disease. Electronically Signed   By: Sharlet Salina M.D.   On: 02/17/2023 15:19    EKG: Independently reviewed.  Sinus tachycardia.  Assessment/Plan Principal Problem:   Acute bronchitis Active Problems:   Lactic acidosis   Hyperglycemia   Hypokalemia   SIRS (systemic inflammatory response syndrome) (HCC)    Acute bronchitis -     will continue with as needed and scheduled albuterol nebulizer with Pulmicort and IV steroids.  Check respiratory viral panel.  COVID test was negative yesterday.  Patient able to complete sentences without difficulty. Lactic acidosis/SIRS likely from respiratory distress.  Blood cultures have been sent.  Fluid bolus ordered.  Was empirically started on antibiotic.  CT chest does not show any signs of pneumonia.  Recheck lactic acid after fluid bolus. Hypokalemia cause not clear.  Replace and recheck. Hyperglycemia check hemoglobin A1c.   DVT prophylaxis: Lovenox. Code Status: Full code. Family Communication: Discussed with patient. Disposition Plan: Progressive floor. Consults called: None. Admission status: Observation.

## 2023-02-18 NOTE — ED Notes (Signed)
Notified Dr. Arvilla Market of Lactic 6.4 and the assigned unit will not accept the patient and needs a higher level of care

## 2023-02-18 NOTE — ED Notes (Signed)
ED TO INPATIENT HANDOFF REPORT  ED Nurse Name and Phone #: Donnella Bi [phone]  S Name/Age/Gender Shawna Curtis 32 y.o. female Room/Bed: 008C/008C  Code Status   Code Status: Full Code  Home/SNF/Other Home Patient oriented to: self, place, time, and situation Is this baseline? No   Triage Complete: Triage complete  Chief Complaint Acute bronchitis [J20.9]  Triage Note Pt reports that two of her children have recently been sick and approximately five days ago she began having URI symptoms. Pt reports that since yesterday she has been having increasing SOB. Seen at [facility] and given prednisone course and albuterol inhaler without relief. Pt denies pain currently.    Allergies Allergies  Allergen Reactions   Shellfish Allergy Hives    Level of Care/Admitting Diagnosis ED Disposition     ED Disposition  Admit   Condition  --   Comment  Hospital Area: [facility] [[id number]]  Level of Care: Progressive [102]  Admit to Progressive based on following criteria: RESPIRATORY PROBLEMS hypoxemic/hypercapnic respiratory failure that is responsive to NIPPV (BiPAP) or High Flow Nasal Cannula (6-80 lpm). Frequent assessment/intervention, no > Q2 hrs < Q4 hrs, to maintain oxygenation and pulmonary hygiene.  May place patient in observation at [facility] or [facility] if equivalent level of care is available:: No  Covid Evaluation: Asymptomatic - no recent exposure (last 10 days) testing not required  Diagnosis: Acute bronchitis [466.0.ICD-9-CM]  Admitting Physician: Eduard Clos [[id number]]  Attending Physician: Eduard Clos [phone]          B Medical/Surgery History Past Medical History:  Diagnosis Date   Allergy    Dyspnea    GERD (gastroesophageal reflux disease)    Headache    Keratosis pilaris    PCOS (polycystic ovarian syndrome)    Past Surgical History:  Procedure Laterality Date   CESAREAN SECTION     CESAREAN SECTION N/A  01/16/2021   Procedure: CESAREAN SECTION;  Surgeon: Kathrynn Running, MD;  Location: [facility] LD ORS;  Service: Obstetrics;  Laterality: N/A;   OTHER SURGICAL HISTORY Left    cyst removed from left thumb     A IV Location/Drains/Wounds Patient Lines/Drains/Airways Status     Active Line/Drains/Airways     Name Placement date Placement time Site Days   Peripheral IV 02/17/23 20 G 1" Right Antecubital 02/17/23  1940  Antecubital  1   Peripheral IV 02/18/23 20 G 1" Left Antecubital 02/18/23  0005  Antecubital  less than 1   Incision (Closed) 01/16/21 Abdomen 01/16/21  1405  -- 763   Incision (Closed) 01/16/21 Perineum 01/16/21  1405  -- 763            Intake/Output Last 24 hours  Intake/Output Summary (Last 24 hours) at 02/18/2023 0800 Last data filed at 02/18/2023 0300 Gross per 24 hour  Intake 4190 ml  Output --  Net 4190 ml    Labs/Imaging Results for orders placed or performed during the hospital encounter of 02/17/23 (from the past 48 hour(s))  SARS Coronavirus 2 by RT PCR (hospital order, performed in [facility] hospital lab) *cepheid single result test* Anterior Nasal Swab     Status: None   Collection Time: 02/17/23  3:01 PM   Specimen: Anterior Nasal Swab  Result Value Ref Range   SARS Coronavirus 2 by RT PCR NEGATIVE NEGATIVE    Comment: Performed at [facility], [address], [place], [place] [address]  POC Urine Pregnancy, ED (not at  [facility] or [facility])     Status: None   Collection Time: 02/17/23 10:02 PM  Result Value Ref Range   Preg Test, Ur NEGATIVE NEGATIVE    Comment:        THE SENSITIVITY OF THIS METHODOLOGY IS >24 mIU/mL   CBC with Differential     Status: Abnormal   Collection Time: 02/17/23 10:35 PM  Result Value Ref Range   WBC 13.5 (H) 4.0 - 10.5 K/uL   RBC 4.74 3.87 - 5.11 MIL/uL   Hemoglobin 12.9 12.0 - 15.0 g/dL   HCT 40.3 47.4 - 25.9 %   MCV 86.7 80.0 - 100.0 fL   MCH 27.2 26.0 - 34.0 pg   MCHC 31.4 30.0 - 36.0 g/dL   RDW 56.3 87.5 -  64.3 %   Platelets 279 150 - 400 K/uL   nRBC 0.0 0.0 - 0.2 %   Neutrophils Relative % 88 %   Neutro Abs 12.0 (H) 1.7 - 7.7 K/uL   Lymphocytes Relative 10 %   Lymphs Abs 1.3 0.7 - 4.0 K/uL   Monocytes Relative 1 %   Monocytes Absolute 0.1 0.1 - 1.0 K/uL   Eosinophils Relative 0 %   Eosinophils Absolute 0.0 0.0 - 0.5 K/uL   Basophils Relative 0 %   Basophils Absolute 0.0 0.0 - 0.1 K/uL   Immature Granulocytes 1 %   Abs Immature Granulocytes 0.09 (H) 0.00 - 0.07 K/uL    Comment: Performed at [facility], [address], [place], [place] [address]  Comprehensive metabolic panel     Status: Abnormal   Collection Time: 02/17/23 10:35 PM  Result Value Ref Range   Sodium 137 135 - 145 mmol/L   Potassium 2.9 (L) 3.5 - 5.1 mmol/L   Chloride 105 98 - 111 mmol/L   CO2 18 (L) 22 - 32 mmol/L   Glucose, Bld 215 (H) 70 - 99 mg/dL    Comment: Glucose reference range applies only to samples taken after fasting for at least 8 hours.   BUN 15 6 - 20 mg/dL   Creatinine, Ser 8.84 0.44 - 1.00 mg/dL   Calcium 9.0 8.9 - 16.6 mg/dL   Total Protein 7.7 6.5 - 8.1 g/dL   Albumin 3.7 3.5 - 5.0 g/dL   AST 26 15 - 41 U/L   ALT 10 0 - 44 U/L   Alkaline Phosphatase 49 38 - 126 U/L   Total Bilirubin 0.7 0.3 - 1.2 mg/dL   GFR, Estimated >06 >30 mL/min    Comment: (NOTE) Calculated using the CKD-EPI Creatinine Equation (2021)    Anion gap 14 5 - 15    Comment: Performed at [facility], [address], [place], [place] [address]  Lactic acid, plasma     Status: Abnormal   Collection Time: 02/17/23 10:35 PM  Result Value Ref Range   Lactic Acid, Venous 4.8 (HH) 0.5 - 1.9 mmol/L    Comment: CRITICAL RESULT CALLED TO, READ BACK BY AND VERIFIED WITH  Runell Gess, RN, 2343, 02/17/23, EADEDOKUN Performed at [facility], [address], [place], [place] [address]   Troponin I (High Sensitivity)     Status: None   Collection Time: 02/17/23 10:35 PM  Result Value Ref Range   Troponin I (High  Sensitivity) 2 <18 ng/L    Comment: (NOTE) Elevated high sensitivity troponin I (hsTnI) values and significant  changes across serial measurements may suggest ACS but many other  chronic and acute conditions  are known to elevate hsTnI results.  Refer to the "Links" section for chest pain algorithms and additional  guidance. Performed at [facility], [address], [place], [place] [address]   Troponin I (High Sensitivity)     Status: Abnormal   Collection Time: 02/18/23 12:05 AM  Result Value Ref Range   Troponin I (High Sensitivity) 20 (H) <18 ng/L    Comment: (NOTE) Elevated high sensitivity troponin I (hsTnI) values and significant  changes across serial measurements may suggest ACS but many other  chronic and acute conditions are known to elevate hsTnI results.  Refer to the "Links" section for chest pain algorithms and additional  guidance. Performed at [facility], [address] [place], [place] [address]   Lactic acid, plasma     Status: Abnormal   Collection Time: 02/18/23  1:12 AM  Result Value Ref Range   Lactic Acid, Venous 6.4 (HH) 0.5 - 1.9 mmol/L    Comment: CRITICAL VALUE NOTED. VALUE IS CONSISTENT WITH PREVIOUSLY REPORTED/CALLED VALUE Performed at [facility], [address] [place], [place] [address]   I-Stat arterial blood gas, ED     Status: Abnormal   Collection Time: 02/18/23  3:16 AM  Result Value Ref Range   pH, Arterial 7.308 (L) 7.35 - 7.45   pCO2 arterial 29.5 (L) 32 - 48 mmHg   pO2, Arterial 74 (L) 83 - 108 mmHg   Bicarbonate 14.8 (L) 20.0 - 28.0 mmol/L   TCO2 16 (L) 22 - 32 mmol/L   O2 Saturation 93 %   Acid-base deficit 10.0 (H) 0.0 - 2.0 mmol/L   Sodium 142 135 - 145 mmol/L   Potassium 4.0 3.5 - 5.1 mmol/L   Calcium, Ion 1.17 1.15 - 1.40 mmol/L   HCT 35.0 (L) 36.0 - 46.0 %   Hemoglobin 11.9 (L) 12.0 - 15.0 g/dL   Patient temperature 96.2 F    Collection site RADIAL, ALLEN'S TEST ACCEPTABLE    Drawn by Operator     Sample type ARTERIAL   CBG monitoring, ED     Status: Abnormal   Collection Time: 02/18/23  3:49 AM  Result Value Ref Range   Glucose-Capillary 153 (H) 70 - 99 mg/dL    Comment: Glucose reference range applies only to samples taken after fasting for at least 8 hours.  Respiratory (~20 pathogens) panel by PCR     Status: Abnormal   Collection Time: 02/18/23  4:23 AM   Specimen: Nasopharyngeal Swab; Respiratory  Result Value Ref Range   Adenovirus NOT DETECTED NOT DETECTED   Coronavirus 229E NOT DETECTED NOT DETECTED    Comment: (NOTE) The Coronavirus on the Respiratory Panel, DOES NOT test for the novel  Coronavirus (2019 nCoV)    Coronavirus HKU1 NOT DETECTED NOT DETECTED   Coronavirus NL63 NOT DETECTED NOT DETECTED   Coronavirus OC43 NOT DETECTED NOT DETECTED   Metapneumovirus NOT DETECTED NOT DETECTED   Rhinovirus / Enterovirus DETECTED (A) NOT DETECTED   Influenza A NOT DETECTED NOT DETECTED   Influenza B NOT DETECTED NOT DETECTED   Parainfluenza Virus 1 NOT DETECTED NOT DETECTED   Parainfluenza Virus 2 NOT DETECTED NOT DETECTED   Parainfluenza Virus 3 NOT DETECTED NOT DETECTED   Parainfluenza Virus 4 NOT DETECTED NOT DETECTED   Respiratory Syncytial Virus NOT DETECTED NOT DETECTED   Bordetella pertussis NOT DETECTED NOT DETECTED   Bordetella Parapertussis NOT DETECTED NOT DETECTED   Chlamydophila pneumoniae NOT DETECTED NOT DETECTED  Mycoplasma pneumoniae NOT DETECTED NOT DETECTED    Comment: Performed at [facility], [address], [place], [place] [address]  CBC     Status: Abnormal   Collection Time: 02/18/23  6:26 AM  Result Value Ref Range   WBC 13.9 (H) 4.0 - 10.5 K/uL   RBC 4.01 3.87 - 5.11 MIL/uL   Hemoglobin 11.4 (L) 12.0 - 15.0 g/dL   HCT 32.4 (L) 40.1 - 02.7 %   MCV 87.3 80.0 - 100.0 fL   MCH 28.4 26.0 - 34.0 pg   MCHC 32.6 30.0 - 36.0 g/dL   RDW 25.3 66.4 - 40.3 %   Platelets 265 150 - 400 K/uL   nRBC 0.0 0.0 - 0.2 %    Comment: Performed at  [facility], [address], [place], [place] [address]  Lactic acid, plasma     Status: Abnormal   Collection Time: 02/18/23  6:26 AM  Result Value Ref Range   Lactic Acid, Venous 2.9 (HH) 0.5 - 1.9 mmol/L    Comment: CRITICAL VALUE NOTED. VALUE IS CONSISTENT WITH PREVIOUSLY REPORTED/CALLED VALUE Performed at [facility], [address] [place], [place] [address]    CT Angio Chest PE W and/or Wo Contrast  Result Date: 02/18/2023 CLINICAL DATA:  Shortness of breath. EXAM: CT ANGIOGRAPHY CHEST WITH CONTRAST TECHNIQUE: Multidetector CT imaging of the chest was performed using the standard protocol during bolus administration of intravenous contrast. Multiplanar CT image reconstructions and MIPs were obtained to evaluate the vascular anatomy. RADIATION DOSE REDUCTION: This exam was performed according to the departmental dose-optimization program which includes automated exposure control, adjustment of the mA and/or kV according to patient size and/or use of iterative reconstruction technique. CONTRAST:  75mL OMNIPAQUE IOHEXOL 350 MG/ML SOLN COMPARISON:  Chest radiograph dated 02/17/2023. FINDINGS: Cardiovascular: There is no cardiomegaly or pericardial effusion. The thoracic aorta is unremarkable. The origins of the great vessels of the arch appear patent. Evaluation of the pulmonary arteries is limited due to respiratory motion and suboptimal opacification and timing of the contrast. No pulmonary artery embolus identified. Mediastinum/Nodes: No hilar or mediastinal adenopathy. The esophagus and the thyroid gland are grossly unremarkable. No mediastinal fluid collection. Lungs/Pleura: No focal consolidation, pleural effusion, or pneumothorax. The central airways are patent. Upper Abdomen: No acute abnormality. Musculoskeletal: No acute osseous pathology. Review of the MIP images confirms the above findings. IMPRESSION: No acute intrathoracic pathology. No CT evidence of pulmonary artery  embolus. Electronically Signed   By: Elgie Collard M.D.   On: 02/18/2023 02:16   DG Chest 2 View  Result Date: 02/17/2023 CLINICAL DATA:  Short of breath EXAM: CHEST - 2 VIEW COMPARISON:  None Available. FINDINGS: The heart size and mediastinal contours are within normal limits. Both lungs are clear. The visualized skeletal structures are unremarkable. IMPRESSION: No active cardiopulmonary disease. Electronically Signed   By: Sharlet Salina M.D.   On: 02/17/2023 15:19    Pending Labs Unresulted Labs (From admission, onward)     Start     Ordered   02/25/23 0500  Creatinine, serum  (enoxaparin (LOVENOX)    CrCl >/= 30 ml/min)  Weekly,   R     Comments: while on enoxaparin therapy    02/18/23 0417   02/18/23 0645  HIV Antibody (routine testing w rflx)  Once,   R        02/18/23 0645   02/18/23 0451  Basic metabolic panel  ONCE - STAT,  STAT        02/18/23 0450   02/18/23 0451  Magnesium  ONCE - STAT,   STAT        02/18/23 0450   02/18/23 0451  Lactic acid, plasma  STAT Now then every 3 hours,   R (with STAT occurrences)      02/18/23 0450   02/17/23 2353  Culture, blood (routine x 2)  BLOOD CULTURE X 2,   R      02/17/23 2352            Vitals/Pain Today's Vitals   02/18/23 0630 02/18/23 0715 02/18/23 0719 02/18/23 0723  BP: 107/63   109/75  Pulse: 97 99  85  Resp: (!) 21 19  18   Temp:   97.8 F (36.6 C)   TempSrc:      SpO2: 95% 96%  96%  Weight:      Height:      PainSc:        Isolation Precautions No active isolations  Medications Medications  enoxaparin (LOVENOX) injection 40 mg (has no administration in time range)  acetaminophen (TYLENOL) tablet 650 mg (has no administration in time range)    Or  acetaminophen (TYLENOL) suppository 650 mg (has no administration in time range)  albuterol (PROVENTIL) (2.5 MG/3ML) 0.083% nebulizer solution 2.5 mg (2.5 mg Nebulization Not Given 02/18/23 0459)  albuterol (PROVENTIL) (2.5 MG/3ML) 0.083% nebulizer solution  2.5 mg (has no administration in time range)  budesonide (PULMICORT) nebulizer solution 0.25 mg (has no administration in time range)  methylPREDNISolone sodium succinate (SOLU-MEDROL) 125 mg/2 mL injection 80 mg (has no administration in time range)  ipratropium-albuterol (DUONEB) 0.5-2.5 (3) MG/3ML nebulizer solution 3 mL (3 mLs Nebulization Given 02/17/23 1953)  magnesium sulfate IVPB 2 g 50 mL (0 g Intravenous Stopped 02/17/23 2106)  methylPREDNISolone sodium succinate (SOLU-MEDROL) 125 mg/2 mL injection 125 mg (125 mg Intravenous Given 02/17/23 1944)  sodium chloride 0.9 % bolus 1,000 mL (0 mLs Intravenous Stopped 02/17/23 2106)  albuterol (PROVENTIL) (2.5 MG/3ML) 0.083% nebulizer solution (20 mg/hr Nebulization Given 02/17/23 2125)  lactated ringers bolus 2,790 mL (0 mLs Intravenous Stopped 02/18/23 0300)  cefTRIAXone (ROCEPHIN) 1 g in sodium chloride 0.9 % 100 mL IVPB (0 g Intravenous Stopped 02/18/23 0050)  azithromycin (ZITHROMAX) 500 mg in sodium chloride 0.9 % 250 mL IVPB (0 mg Intravenous Stopped 02/18/23 0158)  iohexol (OMNIPAQUE) 350 MG/ML injection 75 mL (75 mLs Intravenous Contrast Given 02/18/23 0200)  potassium chloride (KLOR-CON) packet 40 mEq (40 mEq Oral Given 02/18/23 0333)  potassium chloride 10 mEq in 100 mL IVPB (0 mEq Intravenous Stopped 02/18/23 0720)  sodium chloride 0.9 % bolus 1,000 mL (0 mLs Intravenous Stopped 02/18/23 0608)    Mobility walks     Focused Assessments Pulmonary Assessment Handoff:  Lung sounds: Bilateral Breath Sounds: Expiratory wheezes, Diminished O2 Device: Room Air      R Recommendations: See Admitting Provider Note  Report given to:   Additional Notes: .

## 2023-02-18 NOTE — Plan of Care (Signed)

## 2023-02-18 NOTE — ED Notes (Signed)
ED TO INPATIENT HANDOFF REPORT  ED Nurse Name and Phone #: [phone]  S Name/Age/Gender Shawna Curtis 32 y.o. female Room/Bed: 008C/008C  Code Status   Code Status: Prior  Home/SNF/Other Home Patient oriented to: self, place, time, and situation Is this baseline? Yes   Triage Complete: Triage complete  Chief Complaint Acute bronchitis [J20.9]  Triage Note Pt reports that two of her children have recently been sick and approximately five days ago she began having URI symptoms. Pt reports that since yesterday she has been having increasing SOB. Seen at [facility] and given prednisone course and albuterol inhaler without relief. Pt denies pain currently.    Allergies Allergies  Allergen Reactions   Shellfish Allergy Hives    Level of Care/Admitting Diagnosis ED Disposition     ED Disposition  Admit   Condition  --   Comment  Hospital Area: [facility] [[id number]]  Level of Care: Progressive [102]  Admit to Progressive based on following criteria: RESPIRATORY PROBLEMS hypoxemic/hypercapnic respiratory failure that is responsive to NIPPV (BiPAP) or High Flow Nasal Cannula (6-80 lpm). Frequent assessment/intervention, no > Q2 hrs < Q4 hrs, to maintain oxygenation and pulmonary hygiene.  May place patient in observation at [facility] or [facility] if equivalent level of care is available:: No  Covid Evaluation: Asymptomatic - no recent exposure (last 10 days) testing not required  Diagnosis: Acute bronchitis [466.0.ICD-9-CM]  Admitting Physician: Eduard Clos [[id number]]  Attending Physician: Eduard Clos [phone]          B Medical/Surgery History Past Medical History:  Diagnosis Date   Allergy    Dyspnea    GERD (gastroesophageal reflux disease)    Headache    Keratosis pilaris    PCOS (polycystic ovarian syndrome)    Past Surgical History:  Procedure Laterality Date   CESAREAN SECTION     CESAREAN SECTION N/A 01/16/2021    Procedure: CESAREAN SECTION;  Surgeon: Kathrynn Running, MD;  Location: [facility] LD ORS;  Service: Obstetrics;  Laterality: N/A;   OTHER SURGICAL HISTORY Left    cyst removed from left thumb     A IV Location/Drains/Wounds Patient Lines/Drains/Airways Status     Active Line/Drains/Airways     Name Placement date Placement time Site Days   Peripheral IV 02/17/23 20 G 1" Right Antecubital 02/17/23  1940  Antecubital  1   Peripheral IV 02/18/23 20 G 1" Left Antecubital 02/18/23  0005  Antecubital  less than 1   Incision (Closed) 01/16/21 Abdomen 01/16/21  1405  -- 763   Incision (Closed) 01/16/21 Perineum 01/16/21  1405  -- 763            Intake/Output Last 24 hours  Intake/Output Summary (Last 24 hours) at 02/18/2023 0140 Last data filed at 02/18/2023 0050 Gross per 24 hour  Intake 1150 ml  Output --  Net 1150 ml    Labs/Imaging Results for orders placed or performed during the hospital encounter of 02/17/23 (from the past 48 hour(s))  SARS Coronavirus 2 by RT PCR (hospital order, performed in [facility] hospital lab) *cepheid single result test* Anterior Nasal Swab     Status: None   Collection Time: 02/17/23  3:01 PM   Specimen: Anterior Nasal Swab  Result Value Ref Range   SARS Coronavirus 2 by RT PCR NEGATIVE NEGATIVE    Comment: Performed at [facility], [address] [place], [place] [address]  POC Urine Pregnancy, ED (not at [facility] or [facility])  Status: None   Collection Time: 02/17/23 10:02 PM  Result Value Ref Range   Preg Test, Ur NEGATIVE NEGATIVE    Comment:        THE SENSITIVITY OF THIS METHODOLOGY IS >24 mIU/mL   CBC with Differential     Status: Abnormal   Collection Time: 02/17/23 10:35 PM  Result Value Ref Range   WBC 13.5 (H) 4.0 - 10.5 K/uL   RBC 4.74 3.87 - 5.11 MIL/uL   Hemoglobin 12.9 12.0 - 15.0 g/dL   HCT 16.1 09.6 - 04.5 %   MCV 86.7 80.0 - 100.0 fL   MCH 27.2 26.0 - 34.0 pg   MCHC 31.4 30.0 - 36.0 g/dL   RDW 40.9 81.1 - 91.4 %    Platelets 279 150 - 400 K/uL   nRBC 0.0 0.0 - 0.2 %   Neutrophils Relative % 88 %   Neutro Abs 12.0 (H) 1.7 - 7.7 K/uL   Lymphocytes Relative 10 %   Lymphs Abs 1.3 0.7 - 4.0 K/uL   Monocytes Relative 1 %   Monocytes Absolute 0.1 0.1 - 1.0 K/uL   Eosinophils Relative 0 %   Eosinophils Absolute 0.0 0.0 - 0.5 K/uL   Basophils Relative 0 %   Basophils Absolute 0.0 0.0 - 0.1 K/uL   Immature Granulocytes 1 %   Abs Immature Granulocytes 0.09 (H) 0.00 - 0.07 K/uL    Comment: Performed at [facility], [address] [place], [place] [address]  Comprehensive metabolic panel     Status: Abnormal   Collection Time: 02/17/23 10:35 PM  Result Value Ref Range   Sodium 137 135 - 145 mmol/L   Potassium 2.9 (L) 3.5 - 5.1 mmol/L   Chloride 105 98 - 111 mmol/L   CO2 18 (L) 22 - 32 mmol/L   Glucose, Bld 215 (H) 70 - 99 mg/dL    Comment: Glucose reference range applies only to samples taken after fasting for at least 8 hours.   BUN 15 6 - 20 mg/dL   Creatinine, Ser 6.21 0.44 - 1.00 mg/dL   Calcium 9.0 8.9 - 30.8 mg/dL   Total Protein 7.7 6.5 - 8.1 g/dL   Albumin 3.7 3.5 - 5.0 g/dL   AST 26 15 - 41 U/L   ALT 10 0 - 44 U/L   Alkaline Phosphatase 49 38 - 126 U/L   Total Bilirubin 0.7 0.3 - 1.2 mg/dL   GFR, Estimated >65 >78 mL/min    Comment: (NOTE) Calculated using the CKD-EPI Creatinine Equation (2021)    Anion gap 14 5 - 15    Comment: Performed at [facility], [address] [place], [place] [address]  Lactic acid, plasma     Status: Abnormal   Collection Time: 02/17/23 10:35 PM  Result Value Ref Range   Lactic Acid, Venous 4.8 (HH) 0.5 - 1.9 mmol/L    Comment: CRITICAL RESULT CALLED TO, READ BACK BY AND VERIFIED WITH  Runell Gess, RN, 2343, 02/17/23, EADEDOKUN Performed at [facility], [address] [place], [place] [address]   Troponin I (High Sensitivity)     Status: None   Collection Time: 02/17/23 10:35 PM  Result Value Ref Range   Troponin I (High Sensitivity)  2 <18 ng/L    Comment: (NOTE) Elevated high sensitivity troponin I (hsTnI) values and significant  changes across serial measurements may suggest ACS but many other  chronic and acute conditions are known to elevate hsTnI results.  Refer to the "Links" section for chest pain algorithms and additional  guidance. Performed at [facility], [address] [place], [place] [address]    DG Chest 2 View  Result Date: 02/17/2023 CLINICAL DATA:  Short of breath EXAM: CHEST - 2 VIEW COMPARISON:  None Available. FINDINGS: The heart size and mediastinal contours are within normal limits. Both lungs are clear. The visualized skeletal structures are unremarkable. IMPRESSION: No active cardiopulmonary disease. Electronically Signed   By: Sharlet Salina M.D.   On: 02/17/2023 15:19    Pending Labs Unresulted Labs (From admission, onward)     Start     Ordered   02/17/23 2353  Culture, blood (routine x 2)  BLOOD CULTURE X 2,   R (with STAT occurrences)      02/17/23 2352   02/17/23 2138  Lactic acid, plasma  Now then every 2 hours,   R (with STAT occurrences)      02/17/23 2138            Vitals/Pain Today's Vitals   02/17/23 2130 02/17/23 2215 02/17/23 2230 02/18/23 0008  BP: 117/78 118/67 (!) 122/59 (!) 109/58  Pulse: 98 (!) 125 (!) 132 (!) 129  Resp: (!) 25   20  Temp:    98.3 F (36.8 C)  TempSrc:    Oral  SpO2: 98% 99% 99% 98%  PainSc:        Isolation Precautions Airborne and Contact precautions  Medications Medications  azithromycin (ZITHROMAX) 500 mg in sodium chloride 0.9 % 250 mL IVPB (500 mg Intravenous New Bag/Given 02/18/23 0058)  ipratropium-albuterol (DUONEB) 0.5-2.5 (3) MG/3ML nebulizer solution 3 mL (3 mLs Nebulization Given 02/17/23 1953)  magnesium sulfate IVPB 2 g 50 mL (0 g Intravenous Stopped 02/17/23 2106)  methylPREDNISolone sodium succinate (SOLU-MEDROL) 125 mg/2 mL injection 125 mg (125 mg Intravenous Given 02/17/23 1944)  sodium chloride 0.9 % bolus  1,000 mL (0 mLs Intravenous Stopped 02/17/23 2106)  albuterol (PROVENTIL) (2.5 MG/3ML) 0.083% nebulizer solution (20 mg/hr Nebulization Given 02/17/23 2125)  lactated ringers bolus 2,790 mL (2,790 mLs Intravenous New Bag/Given 02/18/23 0023)  cefTRIAXone (ROCEPHIN) 1 g in sodium chloride 0.9 % 100 mL IVPB (0 g Intravenous Stopped 02/18/23 0050)    Mobility walks     Focused Assessments Pulmonary Assessment Handoff:  Lung sounds: Bilateral Breath Sounds: Expiratory wheezes, Diminished O2 Device: Room Air      R Recommendations: See Admitting Provider Note  Report given to:   Additional Notes: Please call with any questions

## 2023-02-18 NOTE — ED Notes (Signed)
Patient to CT.

## 2023-02-18 NOTE — Discharge Summary (Addendum)
Physician Discharge Summary  Shawna Curtis XLK:[id number] DOB: 01-30-1991 DOA: 02/17/2023  PCP: Gerre Scull, NP  Admit date: 02/17/2023 Discharge date: 02/18/2023    Admitted From: Home Disposition: Home  Recommendations for Outpatient Follow-up:  Follow up with PCP in 1-2 weeks Please obtain BMP/CBC in one week Please follow up with your PCP on the following pending results: Unresulted Labs (From admission, onward)     Start     Ordered   02/25/23 0500  Creatinine, serum  (enoxaparin (LOVENOX)    CrCl >/= 30 ml/min)  Weekly,   R     Comments: while on enoxaparin therapy    02/18/23 0417   02/17/23 2353  Culture, blood (routine x 2)  BLOOD CULTURE X 2,   R (with STAT occurrences)      02/17/23 2352              Home Health: None Equipment/Devices: None  Discharge Condition: Stable CODE STATUS: Full code Diet recommendation: Regular  Subjective: Patient seen and examined.  She feels much better.  No shortness of breath or other complaint.  She wants to go home.  Brief/Interim Summary: Shawna Curtis is a 32 y.o. female with history of asthma presented to the ER with complaints of shortness of breath and wheezing for past 5 days, no chest pain.  No fever or chills.  Patient states her children has been recently sick and one of them go to daycare and other 2 kindergarten.  Patient had gone to the urgent care 2 days ago and was prescribed albuterol prednisone despite taking which her symptoms did not improve.   In the ER patient was found to be wheezing.  Tachycardic.  Labs show mild leukocytosis and lactic acidosis.  Patient was given nebulizer therapy and since there was some concern for possible sepsis given the lactic acidosis and leukocytosis empirically started on antibiotics.  Was given fluid bolus.  CT angiogram of chest was negative for PE and did not show anything acute.  COVID test was negative yesterday.  Patient was admitted under hospital  service with severe sepsis secondary to acute bronchitis.  This was present on admission.  She met criteria for severe sepsis based on tachycardia, tachypnea, leukocytosis and lactic acid of 6.8.  Patient was hydrated.  Started on nebulizer.  Eventually she was tested for rhinovirus.  Patient has improved significantly.  Does not have shortness of breath.  No wheezing on examination.  Lactic acid has improved to 2.5 although slightly elevated.  Discussed in length with the patient, she feels much better and would prefer to go home.  We will continue to hydrate her as long as she is in the hospital and we will discharge her later today.  I have advised her to continue to drink plenty of water to keep herself hydrated.  Lactic acid was likely elevated in the setting of dehydration.  She has requested to prescribe her albuterol nebulizer solution.  I am also sending her home on 4 days of oral prednisone.  Follow-up with PCP.  Discharge plan was discussed with patient and/or family member and they verbalized understanding and agreed with it.  Discharge Diagnoses:  Active Problems:   Lactic acidosis   Hyperglycemia   Hypokalemia   SIRS (systemic inflammatory response syndrome) (HCC)   Acute bronchitis due to Rhinovirus    Discharge Instructions   Allergies as of 02/18/2023       Reactions   Shellfish Allergy Hives  Medication List     STOP taking these medications    ibuprofen 400 MG tablet Commonly known as: ADVIL       TAKE these medications    albuterol 108 (90 Base) MCG/ACT inhaler Commonly known as: VENTOLIN HFA Inhale 2 puffs into the lungs every 6 (six) hours as needed for wheezing or shortness of breath (Cough). What changed: Another medication with the same name was added. Make sure you understand how and when to take each.   albuterol (2.5 MG/3ML) 0.083% nebulizer solution Commonly known as: PROVENTIL Take 3 mLs (2.5 mg total) by nebulization every 4 (four) hours  as needed for wheezing or shortness of breath. What changed: You were already taking a medication with the same name, and this prescription was added. Make sure you understand how and when to take each.   fexofenadine 180 MG tablet Commonly known as: ALLEGRA Take 1 tablet (180 mg total) by mouth daily.   predniSONE 50 MG tablet Commonly known as: DELTASONE Take 1 tablet (50 mg total) by mouth daily with breakfast for 4 days. What changed:  medication strength how much to take when to take this additional instructions        Follow-up Information     McElwee, Lauren A, NP Follow up in 1 week(s).   Specialty: Internal Medicine Contact information: [address] [place] [address] [phone]                Allergies  Allergen Reactions   Shellfish Allergy Hives    Consultations: None   Procedures/Studies: CT Angio Chest PE W and/or Wo Contrast  Result Date: 02/18/2023 CLINICAL DATA:  Shortness of breath. EXAM: CT ANGIOGRAPHY CHEST WITH CONTRAST TECHNIQUE: Multidetector CT imaging of the chest was performed using the standard protocol during bolus administration of intravenous contrast. Multiplanar CT image reconstructions and MIPs were obtained to evaluate the vascular anatomy. RADIATION DOSE REDUCTION: This exam was performed according to the departmental dose-optimization program which includes automated exposure control, adjustment of the mA and/or kV according to patient size and/or use of iterative reconstruction technique. CONTRAST:  75mL OMNIPAQUE IOHEXOL 350 MG/ML SOLN COMPARISON:  Chest radiograph dated 02/17/2023. FINDINGS: Cardiovascular: There is no cardiomegaly or pericardial effusion. The thoracic aorta is unremarkable. The origins of the great vessels of the arch appear patent. Evaluation of the pulmonary arteries is limited due to respiratory motion and suboptimal opacification and timing of the contrast. No pulmonary artery embolus  identified. Mediastinum/Nodes: No hilar or mediastinal adenopathy. The esophagus and the thyroid gland are grossly unremarkable. No mediastinal fluid collection. Lungs/Pleura: No focal consolidation, pleural effusion, or pneumothorax. The central airways are patent. Upper Abdomen: No acute abnormality. Musculoskeletal: No acute osseous pathology. Review of the MIP images confirms the above findings. IMPRESSION: No acute intrathoracic pathology. No CT evidence of pulmonary artery embolus. Electronically Signed   By: Elgie Collard M.D.   On: 02/18/2023 02:16   DG Chest 2 View  Result Date: 02/17/2023 CLINICAL DATA:  Short of breath EXAM: CHEST - 2 VIEW COMPARISON:  None Available. FINDINGS: The heart size and mediastinal contours are within normal limits. Both lungs are clear. The visualized skeletal structures are unremarkable. IMPRESSION: No active cardiopulmonary disease. Electronically Signed   By: Sharlet Salina M.D.   On: 02/17/2023 15:19     Discharge Exam: Vitals:   02/18/23 1200 02/18/23 1217  BP:    Pulse: 98   Resp: 17   Temp:    SpO2: 95% 97%  Vitals:   02/18/23 0800 02/18/23 1100 02/18/23 1200 02/18/23 1217  BP: 125/72     Pulse: 98 99 98   Resp: 17 18 17    Temp:      TempSrc:      SpO2: 93% 97% 95% 97%  Weight:      Height:        General: Pt is alert, awake, not in acute distress Cardiovascular: RRR, S1/S2 +, no rubs, no gallops Respiratory: CTA bilaterally, no wheezing, no rhonchi Abdominal: Soft, NT, ND, bowel sounds + Extremities: no edema, no cyanosis    The results of significant diagnostics from this hospitalization (including imaging, microbiology, ancillary and laboratory) are listed below for reference.     Microbiology: Recent Results (from the past 240 hour(s))  SARS Coronavirus 2 by RT PCR (hospital order, performed in [facility] hospital lab) *cepheid single result test* Anterior Nasal Swab     Status: None   Collection Time: 02/17/23  3:01 PM    Specimen: Anterior Nasal Swab  Result Value Ref Range Status   SARS Coronavirus 2 by RT PCR NEGATIVE NEGATIVE Final    Comment: Performed at [facility], [address], [place], [place] [address]  Respiratory (~20 pathogens) panel by PCR     Status: Abnormal   Collection Time: 02/18/23  4:23 AM   Specimen: Nasopharyngeal Swab; Respiratory  Result Value Ref Range Status   Adenovirus NOT DETECTED NOT DETECTED Final   Coronavirus 229E NOT DETECTED NOT DETECTED Final    Comment: (NOTE) The Coronavirus on the Respiratory Panel, DOES NOT test for the novel  Coronavirus (2019 nCoV)    Coronavirus HKU1 NOT DETECTED NOT DETECTED Final   Coronavirus NL63 NOT DETECTED NOT DETECTED Final   Coronavirus OC43 NOT DETECTED NOT DETECTED Final   Metapneumovirus NOT DETECTED NOT DETECTED Final   Rhinovirus / Enterovirus DETECTED (A) NOT DETECTED Final   Influenza A NOT DETECTED NOT DETECTED Final   Influenza B NOT DETECTED NOT DETECTED Final   Parainfluenza Virus 1 NOT DETECTED NOT DETECTED Final   Parainfluenza Virus 2 NOT DETECTED NOT DETECTED Final   Parainfluenza Virus 3 NOT DETECTED NOT DETECTED Final   Parainfluenza Virus 4 NOT DETECTED NOT DETECTED Final   Respiratory Syncytial Virus NOT DETECTED NOT DETECTED Final   Bordetella pertussis NOT DETECTED NOT DETECTED Final   Bordetella Parapertussis NOT DETECTED NOT DETECTED Final   Chlamydophila pneumoniae NOT DETECTED NOT DETECTED Final   Mycoplasma pneumoniae NOT DETECTED NOT DETECTED Final    Comment: Performed at [facility], [address], [place], [place] [address]     Labs: BNP (last 3 results) No results for input(s): "BNP" in the last 8760 hours. Basic Metabolic Panel: Recent Labs  Lab 02/17/23 2235 02/18/23 0316 02/18/23 0626  NA 137 142 138  K 2.9* 4.0 4.2  CL 105  --  112*  CO2 18*  --  17*  GLUCOSE 215*  --  156*  BUN 15  --  6  CREATININE 0.80  --  0.66  CALCIUM 9.0  --  8.2*  MG  --   --  2.2    Liver Function Tests: Recent Labs  Lab 02/17/23 2235  AST 26  ALT 10  ALKPHOS 49  BILITOT 0.7  PROT 7.7  ALBUMIN 3.7   No results for input(s): "LIPASE", "AMYLASE" in the last 168 hours. No results for input(s): "AMMONIA" in the last 168 hours. CBC: Recent Labs  Lab 02/17/23 2235  02/18/23 0316 02/18/23 0626  WBC 13.5*  --  13.9*  NEUTROABS 12.0*  --   --   HGB 12.9 11.9* 11.4*  HCT 41.1 35.0* 35.0*  MCV 86.7  --  87.3  PLT 279  --  265   Cardiac Enzymes: No results for input(s): "CKTOTAL", "CKMB", "CKMBINDEX", "TROPONINI" in the last 168 hours. BNP: Invalid input(s): "POCBNP" CBG: Recent Labs  Lab 02/18/23 0349  GLUCAP 153*   D-Dimer No results for input(s): "DDIMER" in the last 72 hours. Hgb A1c No results for input(s): "HGBA1C" in the last 72 hours. Lipid Profile No results for input(s): "CHOL", "HDL", "LDLCALC", "TRIG", "CHOLHDL", "LDLDIRECT" in the last 72 hours. Thyroid function studies No results for input(s): "TSH", "T4TOTAL", "T3FREE", "THYROIDAB" in the last 72 hours.  Invalid input(s): "FREET3" Anemia work up No results for input(s): "VITAMINB12", "FOLATE", "FERRITIN", "TIBC", "IRON", "RETICCTPCT" in the last 72 hours. Urinalysis    Component Value Date/Time   COLORURINE YELLOW 01/16/2021 1050   APPEARANCEUR HAZY (A) 01/16/2021 1050   LABSPEC 1.015 01/16/2021 1050   PHURINE 6.0 01/16/2021 1050   GLUCOSEU NEGATIVE 01/16/2021 1050   HGBUR LARGE (A) 01/16/2021 1050   BILIRUBINUR NEGATIVE 01/16/2021 1050   KETONESUR NEGATIVE 01/16/2021 1050   PROTEINUR NEGATIVE 01/16/2021 1050   UROBILINOGEN 0.2 10/28/2020 1302   NITRITE NEGATIVE 01/16/2021 1050   LEUKOCYTESUR SMALL (A) 01/16/2021 1050   Sepsis Labs Recent Labs  Lab 02/17/23 2235 02/18/23 0626  WBC 13.5* 13.9*   Microbiology Recent Results (from the past 240 hour(s))  SARS Coronavirus 2 by RT PCR (hospital order, performed in [facility] hospital lab) *cepheid single result test*  Anterior Nasal Swab     Status: None   Collection Time: 02/17/23  3:01 PM   Specimen: Anterior Nasal Swab  Result Value Ref Range Status   SARS Coronavirus 2 by RT PCR NEGATIVE NEGATIVE Final    Comment: Performed at [facility], [address] [place], [place] [address]  Respiratory (~20 pathogens) panel by PCR     Status: Abnormal   Collection Time: 02/18/23  4:23 AM   Specimen: Nasopharyngeal Swab; Respiratory  Result Value Ref Range Status   Adenovirus NOT DETECTED NOT DETECTED Final   Coronavirus 229E NOT DETECTED NOT DETECTED Final    Comment: (NOTE) The Coronavirus on the Respiratory Panel, DOES NOT test for the novel  Coronavirus (2019 nCoV)    Coronavirus HKU1 NOT DETECTED NOT DETECTED Final   Coronavirus NL63 NOT DETECTED NOT DETECTED Final   Coronavirus OC43 NOT DETECTED NOT DETECTED Final   Metapneumovirus NOT DETECTED NOT DETECTED Final   Rhinovirus / Enterovirus DETECTED (A) NOT DETECTED Final   Influenza A NOT DETECTED NOT DETECTED Final   Influenza B NOT DETECTED NOT DETECTED Final   Parainfluenza Virus 1 NOT DETECTED NOT DETECTED Final   Parainfluenza Virus 2 NOT DETECTED NOT DETECTED Final   Parainfluenza Virus 3 NOT DETECTED NOT DETECTED Final   Parainfluenza Virus 4 NOT DETECTED NOT DETECTED Final   Respiratory Syncytial Virus NOT DETECTED NOT DETECTED Final   Bordetella pertussis NOT DETECTED NOT DETECTED Final   Bordetella Parapertussis NOT DETECTED NOT DETECTED Final   Chlamydophila pneumoniae NOT DETECTED NOT DETECTED Final   Mycoplasma pneumoniae NOT DETECTED NOT DETECTED Final    Comment: Performed at [facility], [address] [place], [place] [address]     Time coordinating discharge: Over 30 minutes  SIGNED:   Hughie Closs, MD  [facility] 02/18/2023, 2:58  PM *Please note that this is a verbal dictation therefore any spelling or grammatical errors are due to the "[employer]agon [employer] One" system interpretation. If 7PM-7AM,  please contact night-coverage [email or web address]

## 2023-02-19 LAB — CULTURE, BLOOD (ROUTINE X 2): Culture: NO GROWTH

## 2023-02-20 DIAGNOSIS — S92535A Nondisplaced fracture of distal phalanx of left lesser toe(s), initial encounter for closed fracture: Secondary | ICD-10-CM | POA: Diagnosis not present

## 2023-02-20 DIAGNOSIS — M79672 Pain in left foot: Secondary | ICD-10-CM | POA: Diagnosis not present

## 2023-02-20 LAB — CULTURE, BLOOD (ROUTINE X 2): Special Requests: ADEQUATE

## 2023-02-21 LAB — CULTURE, BLOOD (ROUTINE X 2)

## 2023-02-22 LAB — CULTURE, BLOOD (ROUTINE X 2): Culture: NO GROWTH

## 2023-02-23 LAB — CULTURE, BLOOD (ROUTINE X 2): Special Requests: ADEQUATE

## 2023-04-08 ENCOUNTER — Ambulatory Visit (INDEPENDENT_AMBULATORY_CARE_PROVIDER_SITE_OTHER): Payer: BC Managed Care – PPO | Admitting: Nurse Practitioner

## 2023-04-08 ENCOUNTER — Encounter: Payer: Self-pay | Admitting: Nurse Practitioner

## 2023-04-08 VITALS — BP 108/80 | HR 70 | Temp 98.3°F | Ht 63.0 in | Wt 212.0 lb

## 2023-04-08 DIAGNOSIS — R7303 Prediabetes: Secondary | ICD-10-CM

## 2023-04-08 DIAGNOSIS — R5383 Other fatigue: Secondary | ICD-10-CM | POA: Diagnosis not present

## 2023-04-08 DIAGNOSIS — R051 Acute cough: Secondary | ICD-10-CM

## 2023-04-08 DIAGNOSIS — F320 Major depressive disorder, single episode, mild: Secondary | ICD-10-CM

## 2023-04-08 DIAGNOSIS — E782 Mixed hyperlipidemia: Secondary | ICD-10-CM | POA: Diagnosis not present

## 2023-04-08 LAB — CBC WITH DIFFERENTIAL/PLATELET
Absolute Monocytes: 495 cells/uL (ref 200–950)
Basophils Relative: 0.9 %
Eosinophils Absolute: 864 cells/uL — ABNORMAL HIGH (ref 15–500)
Eosinophils Relative: 6.8 %
HCT: 41.5 % (ref 35.0–45.0)
Hemoglobin: 13.6 g/dL (ref 11.7–15.5)
Lymphs Abs: 2832 cells/uL (ref 850–3900)
MCHC: 32.8 g/dL (ref 32.0–36.0)
MCV: 84.2 fL (ref 80.0–100.0)
Neutrophils Relative %: 66.1 %
Platelets: 343 10*3/uL (ref 140–400)
WBC: 12.7 10*3/uL — ABNORMAL HIGH (ref 3.8–10.8)

## 2023-04-08 MED ORDER — QVAR REDIHALER 40 MCG/ACT IN AERB
2.0000 | INHALATION_SPRAY | Freq: Two times a day (BID) | RESPIRATORY_TRACT | 6 refills | Status: DC
Start: 2023-04-08 — End: 2023-04-25

## 2023-04-08 NOTE — Assessment & Plan Note (Signed)
She was recently hospitalized in May 2024 for sepsis associated with bronchitis from rhinovirus. She was discharged with an albuterol inhaler and nebulizer. Over the past few days, she has noticed a worsening cough and sputum. Will have her start qvar inhaler 40mg  2 puffs twice a day. Continue using albuterol as needed. She can start her allegra daily. She has an appointment with asthma and allergy at the end of this month.

## 2023-04-08 NOTE — Progress Notes (Signed)
Acute Office Visit  Subjective:     Patient ID: Shawna Curtis, female    DOB: 1990/10/22, 32 y.o.   MRN: [id number]  Chief Complaint  Patient presents with   Request Lab Work    Check blood sugar and A1C, frequent respiratory infections, lump under left jaw    HPI Patient is in today for wanting her sugars checked. She notes that she has been getting sick more frequently since moving to [place]. She notes that she was hospitalized in May for sepsis associated with bronchitis from rhinovirus. She was having a lot of coughing, wheezing, and shortness of breath. Once she got discharged from the hospital, she was feeling better. She states that about 4-5 days ago, she noticed an increase and cough and phlegm again. She denies shortness of breath, fever, nasal congestion and ear pain. She states that she used her albuterol nebulizer a few  times in the last few days. She also noticed that she had a swollen lump under her left jaw for a few days and then it has gone away.   She is concerned that she may have a vitamin deficiency with her frequent illnesses. Her children are in school and have been sick intermittently. She would like her vitamins and sugars checked today.   She has also noticed some depression that has started getting worse after she stopped breast feeding. She is talking with [place] psychiatrist in [place] She is trying to treat this holistically without medication. She denies SI/HI.   ROS See pertinent positives and negatives per HPI.     Objective:    BP 108/80 (BP Location: Left Arm)   Pulse 70   Temp 98.3 F (36.8 C)   Ht 5\' 3"  (1.6 m)   Wt 212 lb (96.2 kg)   SpO2 97%   BMI 37.55 kg/m     Physical Exam Vitals and nursing note reviewed.  Constitutional:      General: She is not in acute distress.    Appearance: Normal appearance.  HENT:     Head: Normocephalic.  Eyes:     Conjunctiva/sclera: Conjunctivae normal.  Cardiovascular:     Rate and  Rhythm: Normal rate and regular rhythm.     Pulses: Normal pulses.     Heart sounds: Normal heart sounds.  Pulmonary:     Effort: Pulmonary effort is normal.     Breath sounds: Normal breath sounds.  Musculoskeletal:     Cervical back: Normal range of motion and neck supple. No tenderness.  Lymphadenopathy:     Cervical: No cervical adenopathy.  Skin:    General: Skin is warm.  Neurological:     General: No focal deficit present.     Mental Status: She is alert and oriented to person, place, and time.  Psychiatric:        Mood and Affect: Mood normal.        Behavior: Behavior normal.        Thought Content: Thought content normal.        Judgment: Judgment normal.       Assessment & Plan:   Problem List Items Addressed This Visit       Other   Prediabetes - Primary    Chronic, stable. Check A1c today and treat based on results.       Relevant Orders   Hemoglobin A1c   HLD (hyperlipidemia)    Chronic, stable. Check CMP, CBC, lipid panel today. Discussed nutrition, exercise.  Relevant Orders   Lipid panel   Acute cough    She was recently hospitalized in May 2024 for sepsis associated with bronchitis from rhinovirus. She was discharged with an albuterol inhaler and nebulizer. Over the past few days, she has noticed a worsening cough and sputum. Will have her start qvar inhaler 40mg  2 puffs twice a day. Continue using albuterol as needed. She can start her allegra daily. She has an appointment with asthma and allergy at the end of this month.       Depression, major, single episode, mild (HCC)    Chronic, ongoing. She is talking with a psychiatrist in the [place]. Discussed getting regular exercise, outside in the sun, and making time for things she enjoys. Follow-up in 3 months.       Other Visit Diagnoses     Fatigue, unspecified type       Check CMP, CBC, TSH, vitamin B12, and vitamin D today. Encourage regular exercise and sleep.   Relevant Orders    CBC with Differential/Platelet   Comprehensive metabolic panel   TSH   VITAMIN D 25 Hydroxy (Vit-D Deficiency, Fractures)   Vitamin B12       Meds ordered this encounter  Medications   beclomethasone (QVAR REDIHALER) 40 MCG/ACT inhaler    Sig: Inhale 2 puffs into the lungs 2 (two) times daily.    Dispense:  1 each    Refill:  6    Return in about 3 months (around 07/09/2023) for cough, f/u allergist .  Gerre Scull, NP

## 2023-04-08 NOTE — Assessment & Plan Note (Signed)
Chronic, ongoing. She is talking with a psychiatrist in the [place]. Discussed getting regular exercise, outside in the sun, and making time for things she enjoys. Follow-up in 3 months.

## 2023-04-08 NOTE — Assessment & Plan Note (Signed)
Chronic, stable. Check CMP, CBC, lipid panel today. Discussed nutrition, exercise.

## 2023-04-08 NOTE — Assessment & Plan Note (Signed)
Chronic, stable. Check A1c today and treat based on results. 

## 2023-04-08 NOTE — Patient Instructions (Addendum)
It was great to see you!  We are checking your labs today and will let you know the results via mychart/phone.   Start qvar inhaler 2 puffs twice a day.   Keep taking your albuterol inhaler or nebulizer as needed  You can try an ashwagandha supplement to help with mood.   Let's follow-up in 3 months, sooner if you have concerns.  If a referral was placed today, you will be contacted for an appointment. Please note that routine referrals can sometimes take up to 3-4 weeks to process. Please call our office if you haven't heard anything after this time frame.  Take care,  Rodman Pickle, NP

## 2023-04-09 LAB — COMPREHENSIVE METABOLIC PANEL
AG Ratio: 1.4 (calc) (ref 1.0–2.5)
ALT: 12 U/L (ref 6–29)
AST: 14 U/L (ref 10–30)
Albumin: 4.6 g/dL (ref 3.6–5.1)
Alkaline phosphatase (APISO): 55 U/L (ref 31–125)
BUN: 14 mg/dL (ref 7–25)
CO2: 24 mmol/L (ref 20–32)
Calcium: 10.1 mg/dL (ref 8.6–10.2)
Chloride: 101 mmol/L (ref 98–110)
Creat: 0.67 mg/dL (ref 0.50–0.97)
Globulin: 3.3 g/dL (calc) (ref 1.9–3.7)
Glucose, Bld: 92 mg/dL (ref 65–99)
Potassium: 4.5 mmol/L (ref 3.5–5.3)
Sodium: 137 mmol/L (ref 135–146)
Total Bilirubin: 0.4 mg/dL (ref 0.2–1.2)
Total Protein: 7.9 g/dL (ref 6.1–8.1)

## 2023-04-09 LAB — LIPID PANEL
Cholesterol: 170 mg/dL (ref <200)
HDL: 52 mg/dL (ref >=50)
LDL Cholesterol (Calc): 89 mg/dL (calc)
Non-HDL Cholesterol (Calc): 118 mg/dL (calc) (ref <130)
Total CHOL/HDL Ratio: 3.3 (calc) (ref <5.0)
Triglycerides: 202 mg/dL — ABNORMAL HIGH (ref <150)

## 2023-04-09 LAB — HEMOGLOBIN A1C
Hgb A1c MFr Bld: 6.1 % of total Hgb — ABNORMAL HIGH (ref <5.7)
Mean Plasma Glucose: 128 mg/dL
eAG (mmol/L): 7.1 mmol/L

## 2023-04-09 LAB — CBC WITH DIFFERENTIAL/PLATELET
Basophils Absolute: 114 cells/uL (ref 0–200)
MCH: 27.6 pg (ref 27.0–33.0)
MPV: 10.4 fL (ref 7.5–12.5)
Monocytes Relative: 3.9 %
Neutro Abs: 8395 cells/uL — ABNORMAL HIGH (ref 1500–7800)
RBC: 4.93 10*6/uL (ref 3.80–5.10)
RDW: 14.1 % (ref 11.0–15.0)
Total Lymphocyte: 22.3 %

## 2023-04-09 LAB — TSH: TSH: 0.7 mIU/L

## 2023-04-09 LAB — VITAMIN B12: Vitamin B-12: 531 pg/mL (ref 200–1100)

## 2023-04-09 LAB — VITAMIN D 25 HYDROXY (VIT D DEFICIENCY, FRACTURES): Vit D, 25-Hydroxy: 29 ng/mL — ABNORMAL LOW (ref 30–100)

## 2023-04-12 ENCOUNTER — Encounter: Payer: Self-pay | Admitting: Nurse Practitioner

## 2023-04-21 ENCOUNTER — Ambulatory Visit: Payer: BC Managed Care – PPO | Admitting: Nurse Practitioner

## 2023-04-21 ENCOUNTER — Ambulatory Visit (INDEPENDENT_AMBULATORY_CARE_PROVIDER_SITE_OTHER): Payer: BC Managed Care – PPO | Admitting: Allergy & Immunology

## 2023-04-21 ENCOUNTER — Encounter: Payer: Self-pay | Admitting: Allergy & Immunology

## 2023-04-21 ENCOUNTER — Other Ambulatory Visit: Payer: Self-pay

## 2023-04-21 VITALS — BP 122/82 | HR 82 | Temp 98.6°F | Resp 18 | Ht 63.5 in | Wt 215.3 lb

## 2023-04-21 DIAGNOSIS — L500 Allergic urticaria: Secondary | ICD-10-CM

## 2023-04-21 DIAGNOSIS — J3089 Other allergic rhinitis: Secondary | ICD-10-CM

## 2023-04-21 DIAGNOSIS — J302 Other seasonal allergic rhinitis: Secondary | ICD-10-CM | POA: Diagnosis not present

## 2023-04-21 DIAGNOSIS — J4530 Mild persistent asthma, uncomplicated: Secondary | ICD-10-CM | POA: Diagnosis not present

## 2023-04-21 MED ORDER — TRELEGY ELLIPTA 100-62.5-25 MCG/ACT IN AEPB: 1.0000 | INHALATION_SPRAY | Freq: Every day | RESPIRATORY_TRACT | 5 refills | Status: DC

## 2023-04-21 MED ORDER — RYALTRIS 665-25 MCG/ACT NA SUSP: NASAL | 5 refills | Status: AC

## 2023-04-21 NOTE — Patient Instructions (Addendum)
1. Moderate persistent asthma, uncomplicated - Lung testing looks really good today. - Because of your hospitalization and frequent symptoms, I do think he needs to take something every day for your breathing. - We are going to start you on Trelegy 1 puff once daily (contains 3 medications to help with keeping your lungs under control and air moving). - Sample provided. - There is no need to get the Qvar. - Daily controller medication(s): Trelegy 100/62.5/25 one puff once daily - Prior to physical activity: albuterol 2 puffs 10-15 minutes before physical activity. - Rescue medications: albuterol 4 puffs every 4-6 hours as needed - Asthma control goals:  * Full participation in all desired activities (may need albuterol before activity) * Albuterol use two time or less a week on average (not counting use with activity) * Cough interfering with sleep two time or less a month * Oral steroids no more than once a year * No hospitalizations  2. Allergic urticaria - Testing was negative to the most common foods. - There is a the low positive predictive value of food allergy testing and hence the high possibility of false positives. - In contrast, food allergy testing has a high negative predictive value, therefore if testing is negative we can be relatively assured that they are indeed negative.  - I do not think that you need to avoid any particular foods.  3. Seasonal and perennial allergic rhinitis - Testing today showed: grasses, ragweed, weeds, trees, indoor molds, outdoor molds, dust mites, cat, cockroach, and mixed feather, horse - Copy of test results provided.  - Avoidance measures provided. - Stop taking: Zyrtec  - Continue with: nasal saline rinse - Start taking: Ryaltris (olopatadine/mometasone) two sprays per nostril 1-2 times daily as needed - We do not have a sample, but we will send it to the online pharmacy.  - You can use an extra dose of the antihistamine, if needed, for  breakthrough symptoms.  - Consider nasal saline rinses 1-2 times daily to remove allergens from the nasal cavities as well as help with mucous clearance (this is especially helpful to do before the nasal sprays are given) - Consider allergy shots as a means of long-term control. - Allergy shots "re-train" and "reset" the immune system to ignore environmental allergens and decrease the resulting immune response to those allergens (sneezing, itchy watery eyes, runny nose, nasal congestion, etc).    - Allergy shots improve symptoms in 75-85% of patients.  - We can discuss more at the next appointment if the medications are not working for you.  4. Return in about 3 months (around 07/22/2023). You can have the follow up appointment with Dr. Dellis Anes or a Nurse Practicioner (our Nurse Practitioners are excellent and always have Physician oversight!).    Please inform us of any Emergency Department visits, hospitalizations, or changes in symptoms. Call us before going to the ED for breathing or allergy symptoms since we might be able to fit you in for a sick visit. Feel free to contact us anytime with any questions, problems, or concerns.  It was a pleasure to meet you today!  Websites that have reliable patient information: 1. American Academy of Asthma, Allergy, and Immunology: www.aaaai.org 2. Food Allergy Research and Education (FARE): foodallergy.org 3. Mothers of Asthmatics: http://www.asthmacommunitynetwork.org 4. American College of Allergy, Asthma, and Immunology: www.acaai.org   COVID-19 Vaccine Information can be found at: [email or web address] For questions related to vaccine distribution or appointments, please email [email or web address] or call [phone].   We realize  that you might be concerned about having an allergic reaction to the COVID19 vaccines. To help with that concern, WE ARE OFFERING THE COVID19 VACCINES IN OUR  OFFICE! Ask the front desk for dates!     "Like" [place] on Facebook and Instagram for our latest updates!      A healthy democracy works best when [employer] participate! Make sure you are registered to vote! If you have moved or changed any of your contact information, you will need to get this updated before voting!  In some cases, you MAY be able to register to vote online: [email or web address]  Control of Mold Allergen   Mold and fungi can grow on a variety of surfaces provided certain temperature and moisture conditions exist.  Outdoor molds grow on plants, decaying vegetation and soil.  The major outdoor mold, Alternaria and Cladosporium, are found in very high numbers during hot and dry conditions.  Generally, a late Summer - Fall peak is seen for common outdoor fungal spores.  Rain will temporarily lower outdoor mold spore count, but counts rise rapidly when the rainy period ends.  The most important indoor molds are Aspergillus and Penicillium.  Dark, humid and poorly ventilated basements are ideal sites for mold growth.  The next most common sites of mold growth are the bathroom and the kitchen.  Outdoor (Seasonal) Mold Control   Use air conditioning and keep windows closed Avoid exposure to decaying vegetation. Avoid leaf raking. Avoid grain handling. Consider wearing a face mask if working in moldy areas.    Indoor (Perennial) Mold Control    Maintain humidity below 50%. Clean washable surfaces with 5% bleach solution. Remove sources e.g. contaminated carpets.    Control of Dog or Cat Allergen  Avoidance is the best way to manage a dog or cat allergy. If you have a dog or cat and are allergic to dog or cats, consider removing the dog or cat from the home. If you have a dog or cat but don't want to find it a new home, or if your family wants a pet even though someone in the household is allergic, here are some  strategies that may help keep symptoms at bay:  Keep the pet out of your bedroom and restrict it to only a few rooms. Be advised that keeping the dog or cat in only one room will not limit the allergens to that room. Don't pet, hug or kiss the dog or cat; if you do, wash your hands with soap and water. High-efficiency particulate air (HEPA) cleaners run continuously in a bedroom or living room can reduce allergen levels over time. Regular use of a high-efficiency vacuum cleaner or a central vacuum can reduce allergen levels. Giving your dog or cat a bath at least once a week can reduce airborne allergen.  Control of Dust Mite Allergen    Dust mites play a major role in allergic asthma and rhinitis.  They occur in environments with high humidity wherever human skin is found.  Dust mites absorb humidity from the atmosphere (ie, they do not drink) and feed on organic matter (including shed human and animal skin).  Dust mites are a microscopic type of insect that you cannot see with the naked eye.  High levels of dust mites have been detected from mattresses, pillows, carpets, upholstered furniture, bed covers, clothes, soft toys and any woven material.  The principal allergen of the dust mite is found in  its feces.  A gram of dust may contain 1,000 mites and 250,000 fecal particles.  Mite antigen is easily measured in the air during house cleaning activities.  Dust mites do not bite and do not cause harm to humans, other than by triggering allergies/asthma.    Ways to decrease your exposure to dust mites in your home:  Encase mattresses, box springs and pillows with a mite-impermeable barrier or cover   Wash sheets, blankets and drapes weekly in hot water (130 F) with detergent and dry them in a dryer on the hot setting.  Have the room cleaned frequently with a vacuum cleaner and a damp dust-mop.  For carpeting or rugs, vacuuming with a vacuum cleaner equipped with a high-efficiency particulate air  (HEPA) filter.  The dust mite allergic individual should not be in a room which is being cleaned and should wait 1 hour after cleaning before going into the room. Do not sleep on upholstered furniture (eg, couches).   If possible removing carpeting, upholstered furniture and drapery from the home is ideal.  Horizontal blinds should be eliminated in the rooms where the person spends the most time (bedroom, study, television room).  Washable vinyl, roller-type shades are optimal. Remove all non-washable stuffed toys from the bedroom.  Wash stuffed toys weekly like sheets and blankets above.   Reduce indoor humidity to less than 50%.  Inexpensive humidity monitors can be purchased at most hardware stores.  Do not use a humidifier as can make the problem worse and are not recommended.  Control of Cockroach Allergen  Cockroach allergen has been identified as an important cause of acute attacks of asthma, especially in urban settings.  There are fifty-five species of cockroach that exist in the [place], however only three, the [place], [place] species produce allergen that can affect patients with Asthma.  Allergens can be obtained from fecal particles, egg casings and secretions from cockroaches.    Remove food sources. Reduce access to water. Seal access and entry points. Spray runways with 0.5-1% Diazinon or Chlorpyrifos Blow boric acid power under stoves and refrigerator. Place bait stations (hydramethylnon) at feeding sites.

## 2023-04-21 NOTE — Progress Notes (Signed)
NEW PATIENT  Date of Service/Encounter:  04/21/23  Consult requested by: Gerre Scull, NP   Assessment:   Mild persistent asthma, uncomplicated   Allergic urticaria - with negative testing to the most common foods  Seasonal and perennial allergic rhinitis (grasses, ragweed, weeds, trees, indoor molds, outdoor molds, dust mites, cat, cockroach, and mixed feather, horse)  Plan/Recommendations:    1. Moderate persistent asthma, uncomplicated - Lung testing looks really good today. - Because of your hospitalization and frequent symptoms, I do think he needs to take something every day for your breathing. - We are going to start you on Trelegy 1 puff once daily (contains 3 medications to help with keeping your lungs under control and air moving). - Sample provided. - There is no need to get the Qvar. - Daily controller medication(s): Trelegy 100/62.5/25 one puff once daily - Prior to physical activity: albuterol 2 puffs 10-15 minutes before physical activity. - Rescue medications: albuterol 4 puffs every 4-6 hours as needed - Asthma control goals:  * Full participation in all desired activities (may need albuterol before activity) * Albuterol use two time or less a week on average (not counting use with activity) * Cough interfering with sleep two time or less a month * Oral steroids no more than once a year * No hospitalizations  2. Allergic urticaria - Testing was negative to the most common foods. - There is a the low positive predictive value of food allergy testing and hence the high possibility of false positives. - In contrast, food allergy testing has a high negative predictive value, therefore if testing is negative we can be relatively assured that they are indeed negative.  - I do not think that you need to avoid any particular foods.  3. Seasonal and perennial allergic rhinitis - Testing today showed: grasses, ragweed, weeds, trees, indoor molds, outdoor  molds, dust mites, cat, cockroach, and mixed feather, horse - Copy of test results provided.  - Avoidance measures provided. - Stop taking: Zyrtec  - Continue with: nasal saline rinse - Start taking: Ryaltris (olopatadine/mometasone) two sprays per nostril 1-2 times daily as needed - We do not have a sample, but we will send it to the online pharmacy.  - You can use an extra dose of the antihistamine, if needed, for breakthrough symptoms.  - Consider nasal saline rinses 1-2 times daily to remove allergens from the nasal cavities as well as help with mucous clearance (this is especially helpful to do before the nasal sprays are given) - Consider allergy shots as a means of long-term control. - Allergy shots "re-train" and "reset" the immune system to ignore environmental allergens and decrease the resulting immune response to those allergens (sneezing, itchy watery eyes, runny nose, nasal congestion, etc).    - Allergy shots improve symptoms in 75-85% of patients.  - We can discuss more at the next appointment if the medications are not working for you.  4. Return in about 3 months (around 07/22/2023). You can have the follow up appointment with Dr. Dellis Anes or a Nurse Practicioner (our Nurse Practitioners are excellent and always have Physician oversight!).     This note in its entirety was forwarded to the Provider who requested this consultation.  Subjective:   Shawna Curtis is a 32 y.o. female presenting today for evaluation of  Chief Complaint  Patient presents with   Allergy Testing   Asthma    Shawna Curtis has a history of the following: Patient  Active Problem List   Diagnosis Date Noted   Acute cough 04/08/2023   Depression, major, single episode, mild (HCC) 04/08/2023   Acute bronchitis 02/18/2023   Lactic acidosis 02/18/2023   Hyperglycemia 02/18/2023   Hypokalemia 02/18/2023   SIRS (systemic inflammatory response syndrome) (HCC) 02/18/2023    Acute bronchitis due to Rhinovirus 02/18/2023   Prediabetes 07/09/2022   HLD (hyperlipidemia) 07/09/2022   PCOS (polycystic ovarian syndrome) 05/07/2022   Seasonal allergies 05/07/2022   KP (keratosis pilaris) 05/07/2022   Obesity (BMI 30-39.9) 05/07/2022   Acute bilateral low back pain without sciatica 04/23/2021   History of cesarean section 01/16/2021   Rubella non-immune status, antepartum 11/04/2020   Supervision of high risk pregnancy, antepartum 10/28/2020    History obtained from: chart review and patient. She moved here two years ago from [place]. She is from [place]. Her   Shawna Curtis was referred by Gerre Scull, NP.     Shawna Curtis is a 32 y.o. female presenting for an evaluation of asthma and allergies .   Asthma/Respiratory Symptom History: She has a history of asthma. She also is worried because she was in the hospital for an asthma exacerbation and wheezing. She got a lot of steroids and other medications to treat this.  She had a lot of antibiotics as well. She was hospitalized in May 2024 for her asthma. She did have asthma [place] a child in the [place] This was gone when she was 32 years old. She is supposed to be on Qvar but she has not picked it up. She uses her nebulizer around once per day. She is very hesitant to use any medications for the most part. We did discuss the problems with using albuterol on a daily basis.   Allergic Rhinitis Symptom History: She has a history of environmental allergies since she was young.  This seemed to become more quiescent as she got older and then emerged again in her late 34s over the last few years.   Skin Symptom History: She has a lot of hives.  She is not sure if these are food related. She does not avoid any foods in particular. She does not have an EpiPen. She has never had an anaphylactic reaction. She has never been skin tested.   Otherwise, there is no history of other atopic diseases, including drug  allergies, stinging insect allergies, or contact dermatitis. There is no significant infectious history. Vaccinations are up to date.    Past Medical History: Patient Active Problem List   Diagnosis Date Noted   Acute cough 04/08/2023   Depression, major, single episode, mild (HCC) 04/08/2023   Acute bronchitis 02/18/2023   Lactic acidosis 02/18/2023   Hyperglycemia 02/18/2023   Hypokalemia 02/18/2023   SIRS (systemic inflammatory response syndrome) (HCC) 02/18/2023   Acute bronchitis due to Rhinovirus 02/18/2023   Prediabetes 07/09/2022   HLD (hyperlipidemia) 07/09/2022   PCOS (polycystic ovarian syndrome) 05/07/2022   Seasonal allergies 05/07/2022   KP (keratosis pilaris) 05/07/2022   Obesity (BMI 30-39.9) 05/07/2022   Acute bilateral low back pain without sciatica 04/23/2021   History of cesarean section 01/16/2021   Rubella non-immune status, antepartum 11/04/2020   Supervision of high risk pregnancy, antepartum 10/28/2020    Medication List:  Allergies as of 04/21/2023       Reactions   Shellfish Allergy Hives        Medication List        Accurate as of April 21, 2023 11:59 PM. If you  have any questions, ask your nurse or doctor.          albuterol 108 (90 Base) MCG/ACT inhaler Commonly known as: VENTOLIN HFA Inhale 2 puffs into the lungs every 6 (six) hours as needed for wheezing or shortness of breath (Cough).   albuterol (2.5 MG/3ML) 0.083% nebulizer solution Commonly known as: PROVENTIL Take 3 mLs (2.5 mg total) by nebulization every 4 (four) hours as needed for wheezing or shortness of breath.   fexofenadine 180 MG tablet Commonly known as: ALLEGRA Take 1 tablet (180 mg total) by mouth daily.   Qvar RediHaler 40 MCG/ACT inhaler Generic drug: beclomethasone Inhale 2 puffs into the lungs 2 (two) times daily.   Ryaltris 413-24 MCG/ACT Susp Generic drug: Olopatadine-Mometasone 2 sprays per nostril 1-2 times daily as needed. Started by: Alfonse Spruce   Trelegy Ellipta 100-62.5-25 MCG/ACT Aepb Generic drug: Fluticasone-Umeclidin-Vilant Inhale 1 puff into the lungs daily. Started by: Alfonse Spruce        Birth History: born premature and spent time in the NICU  Developmental History: non-contributory  Past Surgical History: Past Surgical History:  Procedure Laterality Date   CESAREAN SECTION     CESAREAN SECTION N/A 01/16/2021   Procedure: CESAREAN SECTION;  Surgeon: Kathrynn Running, MD;  Location: [facility];  Service: Obstetrics;  Laterality: N/A;   OTHER SURGICAL HISTORY Left    cyst removed from left thumb     Family History: Family History  Problem Relation Age of Onset   Allergic rhinitis Mother    Urticaria Mother    Asthma Mother    Allergic rhinitis Father    Hypertension Father    Urticaria Sister    Asthma Sister    Urticaria Brother    Asthma Brother    Diabetes Maternal Grandfather      Social History: Jeny lives at home with her family.  She and her husband that in [place] when they were both in school.  Her husband is from [place] and is a Hydrographic surveyor.  She has a business degree, but currently stays at home with the kids.  They live in a townhome that is 110 years old.  There is carpeting throughout the home.  She has gas and electric heating and central cooling with many units and fans.  There is a bird inside of the home.  There are no dust mite covers on the pillows, but she does have them on the beds.  There is no tobacco exposure.  She is exposed to dust.  There is a HEPA filter in the home.  She does not live near [place] or industrial area.  She was a smoker from 2009 through 2012.   Review of systems otherwise negative other than that mentioned in the HPI.    Objective:   Blood pressure 122/82, pulse 82, temperature 98.6 F (37 C), temperature source Temporal, resp. rate 18, height 5' 3.5" (1.613 m), weight 215 lb 4.8 oz (97.7 kg), SpO2 95%, currently breastfeeding. Body  mass index is 37.54 kg/m.     Physical Exam Vitals reviewed.  Constitutional:      Appearance: She is well-developed.  HENT:     Head: Normocephalic and atraumatic.     Right Ear: Tympanic membrane, ear canal and external ear normal. No drainage, swelling or tenderness. Tympanic membrane is not injected, scarred, erythematous, retracted or bulging.     Left Ear: Tympanic membrane, ear canal and external ear normal. No drainage, swelling or tenderness. Tympanic membrane  is not injected, scarred, erythematous, retracted or bulging.     Nose: No nasal deformity, septal deviation, mucosal edema or rhinorrhea.     Right Turbinates: Enlarged, swollen and pale.     Left Turbinates: Enlarged, swollen and pale.     Right Sinus: No maxillary sinus tenderness or frontal sinus tenderness.     Left Sinus: No maxillary sinus tenderness or frontal sinus tenderness.     Mouth/Throat:     Mouth: Mucous membranes are not pale and not dry.     Pharynx: Uvula midline.  Eyes:     General:        Right eye: No discharge.        Left eye: No discharge.     Conjunctiva/sclera: Conjunctivae normal.     Right eye: Right conjunctiva is not injected. No chemosis.    Left eye: Left conjunctiva is not injected. No chemosis.    Pupils: Pupils are equal, round, and reactive to light.  Cardiovascular:     Rate and Rhythm: Normal rate and regular rhythm.     Heart sounds: Normal heart sounds.  Pulmonary:     Effort: Pulmonary effort is normal. No tachypnea, accessory muscle usage or respiratory distress.     Breath sounds: Normal breath sounds. No wheezing, rhonchi or rales.     Comments: Moving air well in all lung fields.  No increased work of breathing. Chest:     Chest wall: No tenderness.  Abdominal:     Tenderness: There is no abdominal tenderness. There is no guarding or rebound.  Lymphadenopathy:     Head:     Right side of head: No submandibular, tonsillar or occipital adenopathy.     Left side of  head: No submandibular, tonsillar or occipital adenopathy.     Cervical: No cervical adenopathy.  Skin:    Coloration: Skin is not pale.     Findings: No abrasion, erythema, petechiae or rash. Rash is not papular, urticarial or vesicular.  Neurological:     Mental Status: She is alert.  Psychiatric:        Behavior: Behavior is cooperative.      Diagnostic studies:    Spirometry: results normal (FEV1: 2.58/87%, FVC: 3.81/109%, FEV1/FVC: 68%).    Spirometry consistent with normal pattern.   Allergy Studies:   Airborne Adult Perc  Row Name 04/21/23 1604      Test Information  Time Antigen Placed 1604      Allergen Manufacturer Greer      Location Back      Number of Test 55      Panel 1 Select      Routine  1. Control-Buffer 50% Glycerol Negative      2. Control-Histamine 2+      Grasses  3. Bahia 2+      4. French Southern Territ[place] Negative      5. Johnson Negative      6. Kentucky Blue Negative      7. Meadow Fescue Negative      8. Perennial Rye 2+      9. Timothy Negative      Weeds  10. Ragweed Mix 2+      11. Cocklebur 2+      12. Plantain,  English 2+      13. Baccharis 3+      14. Dog Fennel Negative      15. Guernsey Thistle 2+      16. Lamb's Quarters 2+  17. Sheep Sorrell 2+      18. Rough Pigweed 2+      19. Marsh Elder, Rough 3+      20. Mugwort, Common 2+      Trees  21. Box, Elder 2+      22. Cedar, red Negative      23. Sweet Gum Negative      24. Pecan Pollen Negative      25. Pine Mix 2+      26. Walnut, Black Pollen Negative      27. Red Mulberry Negative      28. Ash Mix 2+      29. Birch Mix 2+      30. Beech American 2+      31. Cottonwood, [place] 2+      32. Hickory, White 2+      33. Maple Mix 2+      34. Oak, [place] Mix Negative      35. Sycamore Eastern Negative      Major Molds Mix (seasonal) 1  36. Alternaria Alternata 2+      37. Cladosporium Herbarum 2+      Major Molds Mix (perennial ) 2  38. Aspergillus Mix 2+      39.  Penicillium Mix 2+      Major Mold Mix (seasonal) 3  40. Bipolaris Sorokiniana (Helminthosporium) 2+      41. Drechslera Spicifera (Curvularia) 3+      42. Mucor Plumbeus Negative      Major Molds Mix (perennial ) 4  43. Fusarium Moniliforme Negative      44. Aureobasidium Pullulans (pullulara) Negative      45. Rhizopus Oryzae 2+      Other Molds  46. Botrytis Cinera 2+      47. Epicoccum Nigrum Negative      48. Phoma Betae Negative      Inhalants  49. Dust Mite Mix 4+      50. Cat Hair 10,000 BAU/ml Negative      51.  Dog Epithelia 2+      52. Mixed Feathers 2+      53. Horse Epithelia 2+      54. Cockroach, German 2+      55. Tobacco Leaf Negative        Food  1. Peanut Negative      2. Soybean Negative      3. Wheat Negative      4. Sesame Negative      5. Milk, Cow Negative      6. Casein Negative      7. Egg White, Chicken Negative      8. Shellfish Mix Negative      9. Fish Mix Negative      10. Cashew Negative      11. Walnut Food Negative      12. Almond Negative      13. Hazelnut Negative        Intradermal  Row Name 04/21/23 1608      Test Information  Time Antigen Placed 1608      Allergen Manufacturer Waynette Buttery      Location Arm      Number of Test 4      Intradermal Select      Intradermal  Control Negative      French Southern Territ[place] 1+      Johnson Negative      Cat Negative          Allergy  testing results were read and interpreted by myself, documented by clinical staff.         Malachi Bonds, MD [facility]

## 2023-04-25 ENCOUNTER — Encounter: Payer: Self-pay | Admitting: Allergy & Immunology

## 2023-04-28 ENCOUNTER — Telehealth: Payer: Self-pay

## 2023-04-28 NOTE — Telephone Encounter (Signed)
-----   Message from Alfonse Spruce sent at 04/25/2023  7:39 AM EDT ----- I noticed when I was finishing up the chart that the results did not go into the discharge paperwork appropriately. So I printed off the results (they are on the back nursing station computer). Can someone call the patient and let her know that we will be mailing the results to her? Thanks much! Also ask how her Trelegy is working for her.

## 2023-04-28 NOTE — Telephone Encounter (Signed)
Spoke with patient, informed her I would mail out her testing results from her visit. Patient stated that the Trelegy makes her feel different for the first 2 hours however after the first 2 hours she feels fine. She said it was hard to explain however she did say that after using it she doesn't have any wheezing. She is going to continue the Trelegy and will call if she feels she needs to stop due to how it makes her feel for the first 2 hours.

## 2023-05-01 NOTE — Telephone Encounter (Signed)
Sounds good - maybe she will get through this in the long term. Glad it is helping the wheezing.

## 2023-12-22 ENCOUNTER — Encounter: Payer: BC Managed Care – PPO | Admitting: Nurse Practitioner

## 2023-12-23 ENCOUNTER — Ambulatory Visit (INDEPENDENT_AMBULATORY_CARE_PROVIDER_SITE_OTHER): Admitting: Nurse Practitioner

## 2023-12-23 ENCOUNTER — Other Ambulatory Visit (HOSPITAL_COMMUNITY)
Admission: RE | Admit: 2023-12-23 | Discharge: 2023-12-23 | Disposition: A | Discharge status: Home / Self Care | Source: Ambulatory Visit | Attending: Nurse Practitioner | Admitting: Nurse Practitioner

## 2023-12-23 ENCOUNTER — Encounter: Payer: Self-pay | Admitting: Nurse Practitioner

## 2023-12-23 VITALS — BP 108/80 | HR 79 | Temp 97.6°F | Ht 63.5 in | Wt 204.4 lb

## 2023-12-23 DIAGNOSIS — E669 Obesity, unspecified: Secondary | ICD-10-CM | POA: Diagnosis not present

## 2023-12-23 DIAGNOSIS — E559 Vitamin D deficiency, unspecified: Secondary | ICD-10-CM | POA: Diagnosis not present

## 2023-12-23 DIAGNOSIS — J302 Other seasonal allergic rhinitis: Secondary | ICD-10-CM

## 2023-12-23 DIAGNOSIS — R7303 Prediabetes: Secondary | ICD-10-CM | POA: Diagnosis not present

## 2023-12-23 DIAGNOSIS — Z0000 Encounter for general adult medical examination without abnormal findings: Secondary | ICD-10-CM | POA: Diagnosis not present

## 2023-12-23 DIAGNOSIS — Z124 Encounter for screening for malignant neoplasm of cervix: Secondary | ICD-10-CM

## 2023-12-23 LAB — COMPREHENSIVE METABOLIC PANEL WITH GFR
ALT: 9 U/L (ref 0–35)
AST: 11 U/L (ref 0–37)
Albumin: 4.8 g/dL (ref 3.5–5.2)
Alkaline Phosphatase: 43 U/L (ref 39–117)
BUN: 8 mg/dL (ref 6–23)
CO2: 27 meq/L (ref 19–32)
Calcium: 9.7 mg/dL (ref 8.4–10.5)
Chloride: 104 meq/L (ref 96–112)
Creatinine, Ser: 0.59 mg/dL (ref 0.40–1.20)
GFR: 118.96 mL/min (ref >60.00)
Glucose, Bld: 96 mg/dL (ref 70–99)
Potassium: 4.1 meq/L (ref 3.5–5.1)
Sodium: 139 meq/L (ref 135–145)
Total Bilirubin: 0.8 mg/dL (ref 0.2–1.2)
Total Protein: 7.7 g/dL (ref 6.0–8.3)

## 2023-12-23 LAB — LIPID PANEL
Cholesterol: 190 mg/dL (ref 0–200)
HDL: 50.4 mg/dL (ref >39.00)
LDL Cholesterol: 114 mg/dL — ABNORMAL HIGH (ref 0–99)
NonHDL: 139.43
Total CHOL/HDL Ratio: 4
Triglycerides: 128 mg/dL (ref 0.0–149.0)
VLDL: 25.6 mg/dL (ref 0.0–40.0)

## 2023-12-23 LAB — CBC WITH DIFFERENTIAL/PLATELET
Basophils Absolute: 0 10*3/uL (ref 0.0–0.1)
Basophils Relative: 0.6 % (ref 0.0–3.0)
Eosinophils Absolute: 0.4 10*3/uL (ref 0.0–0.7)
Eosinophils Relative: 4.9 % (ref 0.0–5.0)
HCT: 40.2 % (ref 36.0–46.0)
Hemoglobin: 13.2 g/dL (ref 12.0–15.0)
Lymphocytes Relative: 31.1 % (ref 12.0–46.0)
Lymphs Abs: 2.4 10*3/uL (ref 0.7–4.0)
MCHC: 32.9 g/dL (ref 30.0–36.0)
MCV: 86.9 fl (ref 78.0–100.0)
Monocytes Absolute: 0.3 10*3/uL (ref 0.1–1.0)
Monocytes Relative: 3.7 % (ref 3.0–12.0)
Neutro Abs: 4.7 10*3/uL (ref 1.4–7.7)
Neutrophils Relative %: 59.7 % (ref 43.0–77.0)
Platelets: 278 10*3/uL (ref 150.0–400.0)
RBC: 4.62 Mil/uL (ref 3.87–5.11)
RDW: 13.6 % (ref 11.5–15.5)
WBC: 7.8 10*3/uL (ref 4.0–10.5)

## 2023-12-23 LAB — HEMOGLOBIN A1C: Hgb A1c MFr Bld: 5.9 % (ref 4.6–6.5)

## 2023-12-23 LAB — VITAMIN D 25 HYDROXY (VIT D DEFICIENCY, FRACTURES): VITD: 19.8 ng/mL — ABNORMAL LOW (ref 30.00–100.00)

## 2023-12-23 MED ORDER — FEXOFENADINE HCL 180 MG PO TABS: 180.0000 mg | ORAL_TABLET | Freq: Every day | ORAL | 3 refills | Status: AC

## 2023-12-23 NOTE — Assessment & Plan Note (Signed)
Check vitamin D levels today and treat based on results. 

## 2023-12-23 NOTE — Assessment & Plan Note (Signed)
 BMI 35.6.  Discussed nutrition, exercise.  Goal is to lose 1 to 2 pounds per week.

## 2023-12-23 NOTE — Assessment & Plan Note (Signed)
Chronic, stable.  Continue Allegra as needed.  Follow-up if symptoms worsen or with any concerns.

## 2023-12-23 NOTE — Progress Notes (Signed)
 BP 108/80 (BP Location: Left Arm, Patient Position: Sitting)   Pulse 79   Temp 97.6 F (36.4 C)   Ht 5' 3.5" (1.613 m)   Wt 204 lb 6.4 oz (92.7 kg)   LMP 11/06/2023 (Approximate)   SpO2 97%   Breastfeeding No   BMI 35.64 kg/m    Subjective:    Patient ID: Shawna Curtis, female    DOB: 26-Feb-1991, 33 y.o.   MRN: [id number]  CC: Chief Complaint  Patient presents with   Annual Exam    With fasting labs, no concerns    HPI: Shawna Curtis is a 33 y.o. female presenting on 12/23/2023 for comprehensive medical examination. Current medical complaints include:none  She currently lives with: spouse, children Menopausal Symptoms: no  Depression and Anxiety Screen done today and results listed below:     12/23/2023    1:24 PM 11/09/2022    9:21 AM 05/07/2022    4:01 PM 02/02/2021    4:28 PM 01/07/2021    4:09 PM  Depression screen PHQ 2/9  Decreased Interest 0 0 1 0 0  Down, Depressed, Hopeless 0 0 1 0 0  PHQ - 2 Score 0 0 2 0 0  Altered sleeping 1  1 0 1  Tired, decreased energy 0  1 0 1  Change in appetite 1  1 0 0  Feeling bad or failure about yourself  0  0 0 0  Trouble concentrating 0  0 0 0  Moving slowly or fidgety/restless 0  0 0 0  Suicidal thoughts 0  0 0 0  PHQ-9 Score 2  5 0 2  Difficult doing work/chores   Not difficult at all        12/23/2023    1:25 PM 05/07/2022    4:01 PM 02/02/2021    4:29 PM 01/07/2021    4:09 PM  GAD 7 : Generalized Anxiety Score  Nervous, Anxious, on Edge 0 0 0 0  Control/stop worrying 0 0 0 0  Worry too much - different things 0 0 0 0  Trouble relaxing 0 0 0 0  Restless 0 0 0 0  Easily annoyed or irritable 0 0 0 0  Afraid - awful might happen 0 0 0 0  Total GAD 7 Score 0 0 0 0    The patient does not have a history of falls. I did not complete a risk assessment for falls. A plan of care for falls was not documented.   Past Medical History:  Past Medical History:  Diagnosis Date   Allergy    Asthma     Dyspnea    GERD (gastroesophageal reflux disease)    Headache    Keratosis pilaris    PCOS (polycystic ovarian syndrome)    Recurrent upper respiratory infection (URI)    Urticaria     Surgical History:  Past Surgical History:  Procedure Laterality Date   CESAREAN SECTION     CESAREAN SECTION N/A 01/16/2021   Procedure: CESAREAN SECTION;  Surgeon: Kathrynn Running, MD;  Location: [facility] LD ORS;  Service: Obstetrics;  Laterality: N/A;   OTHER SURGICAL HISTORY Left    cyst removed from left thumb    Medications:  Current Outpatient Medications on File Prior to Visit  Medication Sig   albuterol (PROVENTIL) (2.5 MG/3ML) 0.083% nebulizer solution Take 3 mLs (2.5 mg total) by nebulization every 4 (four) hours as needed for wheezing or shortness of breath. (Patient not taking: Reported on 12/23/2023)  albuterol (VENTOLIN HFA) 108 (90 Base) MCG/ACT inhaler Inhale 2 puffs into the lungs every 6 (six) hours as needed for wheezing or shortness of breath (Cough). (Patient not taking: Reported on 12/23/2023)   RYALTRIS 665-25 MCG/ACT SUSP 2 sprays per nostril 1-2 times daily as needed. (Patient not taking: Reported on 12/23/2023)   TRELEGY ELLIPTA 100-62.5-25 MCG/ACT AEPB Inhale 1 puff into the lungs daily. (Patient not taking: Reported on 12/23/2023)   No current facility-administered medications on file prior to visit.    Allergies:  Allergies  Allergen Reactions   Shellfish Allergy Hives    Social History:  Social History   Socioeconomic History   Marital status: Married    Spouse name: Not on file   Number of children: Not on file   Years of education: Not on file   Highest education level: Not on file  Occupational History   Not on file  Tobacco Use   Smoking status: Former    Current packs/day: 0.00    Types: Cigarettes    Quit date: 2010    Years since quitting: 15.2    Passive exposure: Never   Smokeless tobacco: Never  Vaping Use   Vaping status: Never Used  Substance  and Sexual Activity   Alcohol use: Not Currently    Comment: occasionally   Drug use: Never   Sexual activity: Not Currently    Birth control/protection: None  Other Topics Concern   Not on file  Social History Narrative   Not on file   Social Drivers of Health   Financial Resource Strain: Not on file  Food Insecurity: Food Insecurity Present (12/31/2020)   Hunger Vital Sign    Worried About Running Out of Food in the Last Year: Never true    Ran Out of Food in the Last Year: Sometimes true  Transportation Needs: Unmet Transportation Needs (12/31/2020)   PRAPARE - Administrator, Civil Service (Medical): No    Lack of Transportation (Non-Medical): Yes  Physical Activity: Not on file  Stress: Not on file  Social Connections: Unknown (02/20/2023)   Received from [facility], [facility]   Social Network    Social Network: Not on file  Intimate Partner Violence: Unknown (02/20/2023)   Received from [employer], [facility]   HITS    Physically Hurt: Not on file    Insult or Talk Down To: Not on file    Threaten Physical Harm: Not on file    Scream or Curse: Not on file   Social History   Tobacco Use  Smoking Status Former   Current packs/day: 0.00   Types: Cigarettes   Quit date: 2010   Years since quitting: 15.2   Passive exposure: Never  Smokeless Tobacco Never   Social History   Substance and Sexual Activity  Alcohol Use Not Currently   Comment: occasionally    Family History:  Family History  Problem Relation Age of Onset   Allergic rhinitis Mother    Urticaria Mother    Asthma Mother    Allergic rhinitis Father    Hypertension Father    Urticaria Sister    Asthma Sister    Urticaria Brother    Asthma Brother    Diabetes Maternal Grandfather     Past medical history, surgical history, medications, allergies, family history and social history reviewed with patient today and changes made to appropriate areas of the chart.   Review of  Systems  Constitutional: Negative.   HENT: Negative.  Eyes: Negative.   Respiratory: Negative.    Cardiovascular: Negative.   Gastrointestinal: Negative.   Genitourinary: Negative.   Musculoskeletal: Negative.   Skin: Negative.   Neurological: Negative.   Psychiatric/Behavioral: Negative.     All other ROS negative except what is listed above and in the HPI.      Objective:    BP 108/80 (BP Location: Left Arm, Patient Position: Sitting)   Pulse 79   Temp 97.6 F (36.4 C)   Ht 5' 3.5" (1.613 m)   Wt 204 lb 6.4 oz (92.7 kg)   LMP 11/06/2023 (Approximate)   SpO2 97%   Breastfeeding No   BMI 35.64 kg/m   Wt Readings from Last 3 Encounters:  12/23/23 204 lb 6.4 oz (92.7 kg)  04/21/23 215 lb 4.8 oz (97.7 kg)  04/08/23 212 lb (96.2 kg)    Physical Exam Vitals and nursing note reviewed. Exam conducted with a chaperone present.  Constitutional:      General: She is not in acute distress.    Appearance: Normal appearance.  HENT:     Head: Normocephalic and atraumatic.     Right Ear: Tympanic membrane, ear canal and external ear normal.     Left Ear: Tympanic membrane, ear canal and external ear normal.     Mouth/Throat:     Mouth: Mucous membranes are moist.     Pharynx: No posterior oropharyngeal erythema.  Eyes:     Conjunctiva/sclera: Conjunctivae normal.  Cardiovascular:     Rate and Rhythm: Normal rate and regular rhythm.     Pulses: Normal pulses.     Heart sounds: Normal heart sounds.  Pulmonary:     Effort: Pulmonary effort is normal.     Breath sounds: Normal breath sounds.  Abdominal:     Palpations: Abdomen is soft.     Tenderness: There is no abdominal tenderness.  Genitourinary:    General: Normal vulva.     Exam position: Lithotomy position.     Labia:        Right: No rash, tenderness or lesion.        Left: No rash, tenderness or lesion.      Vagina: Normal.     Cervix: Normal.     Uterus: Normal.      Adnexa: Right adnexa normal and left  adnexa normal.  Musculoskeletal:        General: Normal range of motion.     Cervical back: Normal range of motion and neck supple.     Right lower leg: No edema.     Left lower leg: No edema.  Lymphadenopathy:     Cervical: No cervical adenopathy.  Skin:    General: Skin is warm and dry.  Neurological:     General: No focal deficit present.     Mental Status: She is alert and oriented to person, place, and time.     Cranial Nerves: No cranial nerve deficit.     Coordination: Coordination normal.     Gait: Gait normal.  Psychiatric:        Mood and Affect: Mood normal.        Behavior: Behavior normal.        Thought Content: Thought content normal.        Judgment: Judgment normal.     Results for orders placed or performed in visit on 04/08/23  CBC with Differential/Platelet   Collection Time: 04/08/23  3:57 PM  Result Value Ref Range   WBC 12.7 (H)  3.8 - 10.8 Thousand/uL   RBC 4.93 3.80 - 5.10 Million/uL   Hemoglobin 13.6 11.7 - 15.5 g/dL   HCT 16.1 09.6 - 04.5 %   MCV 84.2 80.0 - 100.0 fL   MCH 27.6 27.0 - 33.0 pg   MCHC 32.8 32.0 - 36.0 g/dL   RDW 40.9 81.1 - 91.4 %   Platelets 343 140 - 400 Thousand/uL   MPV 10.4 7.5 - 12.5 fL   Neutro Abs 8,395 (H) 1,500 - 7,800 cells/uL   Lymphs Abs 2,832 850 - 3,900 cells/uL   Absolute Monocytes 495 200 - 950 cells/uL   Eosinophils Absolute 864 (H) 15 - 500 cells/uL   Basophils Absolute 114 0 - 200 cells/uL   Neutrophils Relative % 66.1 %   Total Lymphocyte 22.3 %   Monocytes Relative 3.9 %   Eosinophils Relative 6.8 %   Basophils Relative 0.9 %  Comprehensive metabolic panel   Collection Time: 04/08/23  3:57 PM  Result Value Ref Range   Glucose, Bld 92 65 - 99 mg/dL   BUN 14 7 - 25 mg/dL   Creat 7.82 9.56 - 2.13 mg/dL   BUN/Creatinine Ratio SEE NOTE: 6 - 22 (calc)   Sodium 137 135 - 146 mmol/L   Potassium 4.5 3.5 - 5.3 mmol/L   Chloride 101 98 - 110 mmol/L   CO2 24 20 - 32 mmol/L   Calcium 10.1 8.6 - 10.2 mg/dL    Total Protein 7.9 6.1 - 8.1 g/dL   Albumin 4.6 3.6 - 5.1 g/dL   Globulin 3.3 1.9 - 3.7 g/dL (calc)   AG Ratio 1.4 1.0 - 2.5 (calc)   Total Bilirubin 0.4 0.2 - 1.2 mg/dL   Alkaline phosphatase (APISO) 55 31 - 125 U/L   AST 14 10 - 30 U/L   ALT 12 6 - 29 U/L  Hemoglobin A1c   Collection Time: 04/08/23  3:57 PM  Result Value Ref Range   Hgb A1c MFr Bld 6.1 (H) <5.7 % of total Hgb   Mean Plasma Glucose 128 mg/dL   eAG (mmol/L) 7.1 mmol/L  TSH   Collection Time: 04/08/23  3:57 PM  Result Value Ref Range   TSH 0.70 mIU/L  VITAMIN D 25 Hydroxy (Vit-D Deficiency, Fractures)   Collection Time: 04/08/23  3:57 PM  Result Value Ref Range   Vit D, 25-Hydroxy 29 (L) 30 - 100 ng/mL  Vitamin B12   Collection Time: 04/08/23  3:57 PM  Result Value Ref Range   Vitamin B-12 531 200 - 1,100 pg/mL  Lipid panel   Collection Time: 04/08/23  3:57 PM  Result Value Ref Range   Cholesterol 170 <200 mg/dL   HDL 52 > OR = 50 mg/dL   Triglycerides 086 (H) <150 mg/dL   LDL Cholesterol (Calc) 89 mg/dL (calc)   Total CHOL/HDL Ratio 3.3 <5.0 (calc)   Non-HDL Cholesterol (Calc) 118 <130 mg/dL (calc)      Assessment & Plan:   Problem List Items Addressed This Visit       Other   Seasonal allergies   Chronic, stable.  Continue Allegra as needed.  Follow-up if symptoms worsen or with any concerns.      Obesity (BMI 30-39.9)   BMI 35.6.  Discussed nutrition, exercise.  Goal is to lose 1 to 2 pounds per week.      Relevant Orders   Lipid panel   Prediabetes   Chronic, stable. Check A1c today and treat based on results.  Relevant Orders   CBC with Differential/Platelet   Comprehensive metabolic panel with GFR   Lipid panel   Hemoglobin A1c   Vitamin D deficiency   Check vitamin D levels today and treat based on results.       Relevant Orders   VITAMIN D 25 Hydroxy (Vit-D Deficiency, Fractures)   Routine general medical examination at a health care facility - Primary   Health  maintenance reviewed and updated. Discussed nutrition, exercise. Check CMP, CBC today. Follow-up 1 year.        Other Visit Diagnoses       Screening for cervical cancer       Pap done today   Relevant Orders   Cytology - PAP        Follow up plan: Return in about 1 year (around 12/22/2024) for CPE.   LABORATORY TESTING:  - Pap smear: pap done  IMMUNIZATIONS:   - Tdap: Tetanus vaccination status reviewed: Declined today. - Influenza: Declined - Pneumovax: Not applicable - Prevnar: Not applicable - HPV: Not applicable - Shingrix vaccine: Not applicable  SCREENING: -Mammogram: Not applicable  - Colonoscopy: Not applicable  - Bone Density: Not applicable   PATIENT COUNSELING:   Advised to take 1 mg of folate supplement per day if capable of pregnancy.   Sexuality: Discussed sexually transmitted diseases, partner selection, use of condoms, avoidance of unintended pregnancy  and contraceptive alternatives.   Advised to avoid cigarette smoking.  I discussed with the patient that most people either abstain from alcohol or drink within safe limits (<=14/week and <=4 drinks/occasion for males, <=7/weeks and <= 3 drinks/occasion for females) and that the risk for alcohol disorders and other health effects rises proportionally with the number of drinks per week and how often a drinker exceeds daily limits.  Discussed cessation/primary prevention of drug use and availability of treatment for abuse.   Diet: Encouraged to adjust caloric intake to maintain  or achieve ideal body weight, to reduce intake of dietary saturated fat and total fat, to limit sodium intake by avoiding high sodium foods and not adding table salt, and to maintain adequate dietary potassium and calcium preferably from fresh fruits, vegetables, and low-fat dairy products.    stressed the importance of regular exercise  Injury prevention: Discussed safety belts, safety helmets, smoke detector, smoking near  bedding or upholstery.   Dental health: Discussed importance of regular tooth brushing, flossing, and dental visits.    NEXT PREVENTATIVE PHYSICAL DUE IN 1 YEAR. Return in about 1 year (around 12/22/2024) for CPE.  Marinell Igarashi A Ferlin Fairhurst

## 2023-12-23 NOTE — Assessment & Plan Note (Signed)
Chronic, stable. Check A1c today and treat based on results.

## 2023-12-23 NOTE — Assessment & Plan Note (Signed)
Health maintenance reviewed and updated. Discussed nutrition, exercise. Check CMP, CBC today. Follow-up 1 year.   

## 2023-12-23 NOTE — Patient Instructions (Signed)
It was great to see you!  We are checking your labs today and will let you know the results via mychart/phone.   Let's follow-up in 1 year, sooner if you have concerns.  If a referral was placed today, you will be contacted for an appointment. Please note that routine referrals can sometimes take up to 3-4 weeks to process. Please call our office if you haven't heard anything after this time frame.  Take care,  Naina Sleeper, NP  

## 2023-12-26 ENCOUNTER — Encounter: Payer: Self-pay | Admitting: Nurse Practitioner

## 2023-12-26 MED ORDER — VITAMIN D (ERGOCALCIFEROL) 1.25 MG (50000 UNIT) PO CAPS: 50000.0000 [IU] | ORAL_CAPSULE | ORAL | 0 refills | Status: DC

## 2023-12-26 NOTE — Addendum Note (Signed)
 Addended by: Rodman Pickle A on: 12/26/2023 04:08 PM   Modules accepted: Orders

## 2023-12-27 LAB — CYTOLOGY - PAP
Adequacy: ABSENT
Comment: NEGATIVE
Diagnosis: NEGATIVE
High risk HPV: NEGATIVE

## 2024-02-08 DIAGNOSIS — L811 Chloasma: Secondary | ICD-10-CM | POA: Diagnosis not present

## 2024-02-08 DIAGNOSIS — L750 Bromhidrosis: Secondary | ICD-10-CM | POA: Diagnosis not present

## 2024-09-28 ENCOUNTER — Encounter: Payer: Self-pay | Admitting: Nurse Practitioner

## 2024-09-28 ENCOUNTER — Ambulatory Visit: Admitting: Nurse Practitioner

## 2024-09-28 VITALS — BP 122/80 | HR 89 | Temp 97.1°F | Ht 63.5 in | Wt 212.6 lb

## 2024-09-28 DIAGNOSIS — R7303 Prediabetes: Secondary | ICD-10-CM | POA: Diagnosis not present

## 2024-09-28 DIAGNOSIS — Z1283 Encounter for screening for malignant neoplasm of skin: Secondary | ICD-10-CM

## 2024-09-28 DIAGNOSIS — J302 Other seasonal allergic rhinitis: Secondary | ICD-10-CM | POA: Diagnosis not present

## 2024-09-28 DIAGNOSIS — J358 Other chronic diseases of tonsils and adenoids: Secondary | ICD-10-CM | POA: Diagnosis not present

## 2024-09-28 DIAGNOSIS — J069 Acute upper respiratory infection, unspecified: Secondary | ICD-10-CM

## 2024-09-28 DIAGNOSIS — R5383 Other fatigue: Secondary | ICD-10-CM

## 2024-09-28 DIAGNOSIS — E559 Vitamin D deficiency, unspecified: Secondary | ICD-10-CM | POA: Diagnosis not present

## 2024-09-28 LAB — VITAMIN D 25 HYDROXY (VIT D DEFICIENCY, FRACTURES): VITD: 13.31 ng/mL — ABNORMAL LOW (ref 30.00–100.00)

## 2024-09-28 LAB — BASIC METABOLIC PANEL WITH GFR
BUN: 13 mg/dL (ref 6–23)
CO2: 26 meq/L (ref 19–32)
Calcium: 9.4 mg/dL (ref 8.4–10.5)
Chloride: 100 meq/L (ref 96–112)
Creatinine, Ser: 0.78 mg/dL (ref 0.40–1.20)
GFR: 99.71 mL/min
Glucose, Bld: 98 mg/dL (ref 70–99)
Potassium: 4.1 meq/L (ref 3.5–5.1)
Sodium: 135 meq/L (ref 135–145)

## 2024-09-28 LAB — HEMOGLOBIN A1C: Hgb A1c MFr Bld: 6 % (ref 4.6–6.5)

## 2024-09-28 NOTE — Assessment & Plan Note (Signed)
 Occasional use of over-the-counter vitamin D  supplements. Ordered vitamin D  level test.

## 2024-09-28 NOTE — Assessment & Plan Note (Signed)
 Chronic nasal congestion and mucus production, exacerbated by allergies. Use saline nasal spray several times a day and a humidifier at night. Suggested sinus rinse with neti pot if tolerated.

## 2024-09-28 NOTE — Assessment & Plan Note (Signed)
Check A1c today and treat based on results.  

## 2024-09-28 NOTE — Patient Instructions (Addendum)
 It was great to see you!  Keep drinking plenty of fluids and get rest  You can brush your teeth twice a day, floss daily and rinse with mouthwash   I have placed  a referral to dermatology   You can use a humidifier, sinus rinse, and drink fluids to help with post nasal drip   We are checking your labs today and will let you know the results via mychart/phone.   Let's follow-up if symptoms worsen or don't improve   Take care,  Tinnie Harada, NP

## 2024-09-28 NOTE — Assessment & Plan Note (Signed)
 Presence of tonsilloliths with bad breath, one visible stones on examination. Brush teeth twice a day, floss once a day, and use mouthwash daily. Offered referral to ENT for further evaluation if desired.

## 2024-09-28 NOTE — Progress Notes (Signed)
 "  Acute Office Visit  Subjective:     Patient ID: Shawna Curtis, female    DOB: November 12, 1990, 34 y.o.   MRN: [id number]  Chief Complaint  Patient presents with   Sinusitis    Concerns with excessive phlegm, wheezing, dermatology referral request labs to check for diabetes    HPI Discussed the use of AI scribe software for clinical note transcription with the patient, who gave verbal consent to proceed.  History of Present Illness   Shawna Curtis is a 34 year old female who presents with flu-like symptoms and requests a dermatology referral.  She has had flu-like symptoms for 2 days after returning from [place] , with mild fever, headache, nasal congestion, and a dry, persistent cough that does not disturb sleep. She denies sore throat, ear pain, or rhinorrhea. She is using hot water , broth, Tylenol , and occasional ibuprofen  for relief.  She is concerned about multiple skin lesions that appear abnormal to her and requests a dermatology referral for skin cancer screening.   She is worried about possible high blood sugar due to frequent urination and blurry vision. Her last glucose check was about a year ago.  She reports suspected tonsil stones with halitosis despite regular oral hygiene.  She asks about possible autoimmune disease and reports frequent mucus production and allergies. She prefers ways to manage these without medications.  She wants to strengthen her immune system because she and her family get sick often despite what she feels is a good diet. She is taking Ester C.    ROS See pertinent positives and negatives per HPI.     Objective:    BP 122/80 (BP Location: Left Arm, Patient Position: Sitting, Cuff Size: Normal)   Pulse 89   Temp (!) 97.1 F (36.2 C)   Ht 5' 3.5 (1.613 m)   Wt 212 lb 9.6 oz (96.4 kg)   LMP 08/19/2024   SpO2 96%   BMI 37.07 kg/m    Physical Exam Vitals and nursing note reviewed.  Constitutional:       General: She is not in acute distress.    Appearance: Normal appearance.  HENT:     Head: Normocephalic.     Right Ear: Tympanic membrane, ear canal and external ear normal.     Left Ear: Tympanic membrane, ear canal and external ear normal.     Nose: Congestion present.     Mouth/Throat:     Mouth: Mucous membranes are moist.     Pharynx: No posterior oropharyngeal erythema.     Comments: Tonsil stone in left tonsil Eyes:     Conjunctiva/sclera: Conjunctivae normal.  Cardiovascular:     Rate and Rhythm: Normal rate and regular rhythm.     Pulses: Normal pulses.     Heart sounds: Normal heart sounds.  Pulmonary:     Effort: Pulmonary effort is normal.     Breath sounds: Normal breath sounds.  Musculoskeletal:     Cervical back: Normal range of motion and neck supple. No tenderness.  Lymphadenopathy:     Cervical: No cervical adenopathy.  Skin:    General: Skin is warm.  Neurological:     General: No focal deficit present.     Mental Status: She is alert and oriented to person, place, and time.  Psychiatric:        Mood and Affect: Mood normal.        Behavior: Behavior normal.        Thought Content: Thought  content normal.        Judgment: Judgment normal.       Assessment & Plan:   Problem List Items Addressed This Visit       Respiratory   Tonsillolith   Presence of tonsilloliths with bad breath, one visible stones on examination. Brush teeth twice a day, floss once a day, and use mouthwash daily. Offered referral to ENT for further evaluation if desired.        Other   Seasonal allergies   Chronic nasal congestion and mucus production, exacerbated by allergies. Use saline nasal spray several times a day and a humidifier at night. Suggested sinus rinse with neti pot if tolerated.       Prediabetes   Check A1c today and treat based on results.       Relevant Orders   Basic metabolic panel with GFR   Hemoglobin A1c   Vitamin D  deficiency   Occasional use  of over-the-counter vitamin D  supplements. Ordered vitamin D  level test.      Relevant Orders   VITAMIN D  25 Hydroxy (Vit-D Deficiency, Fractures)   Other Visit Diagnoses       Viral upper respiratory tract infection    -  Primary   Most likely viral. Continue hot tea, fluids, OTC medications. Follow-up if not improving.  Continue albuterol  inhaler as needed.     Skin cancer screening       Referral placed to dermatology per patinet request   Relevant Orders   Ambulatory referral to Dermatology     Fatigue, unspecified type       Check ANA, other recent labs reviewed   Relevant Orders   ANA w/Reflex       No orders of the defined types were placed in this encounter.   Return if symptoms worsen or fail to improve.  Tinnie DELENA Harada, NP   "

## 2024-09-29 LAB — ANA W/REFLEX: Anti Nuclear Antibody (ANA): NEGATIVE

## 2024-10-01 ENCOUNTER — Ambulatory Visit: Payer: Self-pay | Admitting: Nurse Practitioner

## 2024-10-01 MED ORDER — VITAMIN D (ERGOCALCIFEROL) 1.25 MG (50000 UNIT) PO CAPS: 50000.0000 [IU] | ORAL_CAPSULE | ORAL | 0 refills | Status: AC
